# Patient Record
Sex: Female | Born: 1980 | Race: White | Hispanic: No | Marital: Married | Smoking: Never smoker
Health system: Southern US, Community
[De-identification: ages and names within clinical notes are randomized; demographics above are authoritative.]

## PROBLEM LIST (undated history)

## (undated) DIAGNOSIS — R51 Headache: Secondary | ICD-10-CM

## (undated) DIAGNOSIS — D649 Anemia, unspecified: Secondary | ICD-10-CM

## (undated) DIAGNOSIS — K219 Gastro-esophageal reflux disease without esophagitis: Secondary | ICD-10-CM

## (undated) DIAGNOSIS — E039 Hypothyroidism, unspecified: Secondary | ICD-10-CM

## (undated) DIAGNOSIS — Z8619 Personal history of other infectious and parasitic diseases: Secondary | ICD-10-CM

## (undated) DIAGNOSIS — B009 Herpesviral infection, unspecified: Secondary | ICD-10-CM

## (undated) DIAGNOSIS — O1205 Gestational edema, complicating the puerperium: Secondary | ICD-10-CM

## (undated) DIAGNOSIS — J45909 Unspecified asthma, uncomplicated: Secondary | ICD-10-CM

## (undated) DIAGNOSIS — G5600 Carpal tunnel syndrome, unspecified upper limb: Secondary | ICD-10-CM

## (undated) DIAGNOSIS — R519 Headache, unspecified: Secondary | ICD-10-CM

## (undated) HISTORY — PX: HAND SURGERY: SHX662

## (undated) HISTORY — PX: WISDOM TOOTH EXTRACTION: SHX21

## (undated) HISTORY — DX: Headache, unspecified: R51.9

## (undated) HISTORY — DX: Headache: R51

---

## 2001-03-17 ENCOUNTER — Emergency Department (HOSPITAL_COMMUNITY): Admission: EM | Admit: 2001-03-17 | Discharge: 2001-03-17 | Payer: Self-pay | Admitting: Emergency Medicine

## 2001-07-21 ENCOUNTER — Emergency Department (HOSPITAL_COMMUNITY): Admission: EM | Admit: 2001-07-21 | Discharge: 2001-07-21 | Payer: Self-pay | Admitting: Emergency Medicine

## 2003-07-11 ENCOUNTER — Emergency Department (HOSPITAL_COMMUNITY): Admission: EM | Admit: 2003-07-11 | Discharge: 2003-07-11 | Payer: Self-pay

## 2003-07-11 ENCOUNTER — Encounter: Payer: Self-pay | Admitting: Emergency Medicine

## 2004-11-24 ENCOUNTER — Inpatient Hospital Stay (HOSPITAL_COMMUNITY): Admission: AD | Admit: 2004-11-24 | Discharge: 2004-11-27 | Payer: Self-pay | Admitting: Obstetrics & Gynecology

## 2007-05-01 ENCOUNTER — Ambulatory Visit (HOSPITAL_COMMUNITY): Admission: RE | Admit: 2007-05-01 | Discharge: 2007-05-01 | Payer: Self-pay | Admitting: Obstetrics & Gynecology

## 2007-05-01 ENCOUNTER — Ambulatory Visit: Payer: Self-pay | Admitting: Vascular Surgery

## 2007-05-01 ENCOUNTER — Encounter (INDEPENDENT_AMBULATORY_CARE_PROVIDER_SITE_OTHER): Payer: Self-pay | Admitting: Obstetrics & Gynecology

## 2007-05-24 ENCOUNTER — Encounter (INDEPENDENT_AMBULATORY_CARE_PROVIDER_SITE_OTHER): Payer: Self-pay | Admitting: Obstetrics & Gynecology

## 2007-05-24 ENCOUNTER — Inpatient Hospital Stay (HOSPITAL_COMMUNITY): Admission: AD | Admit: 2007-05-24 | Discharge: 2007-05-27 | Payer: Self-pay | Admitting: Obstetrics & Gynecology

## 2007-11-12 ENCOUNTER — Emergency Department (HOSPITAL_COMMUNITY): Admission: EM | Admit: 2007-11-12 | Discharge: 2007-11-13 | Payer: Self-pay | Admitting: Emergency Medicine

## 2009-01-29 ENCOUNTER — Telehealth (INDEPENDENT_AMBULATORY_CARE_PROVIDER_SITE_OTHER): Payer: Self-pay | Admitting: *Deleted

## 2009-01-29 ENCOUNTER — Ambulatory Visit: Payer: Self-pay | Admitting: Internal Medicine

## 2009-01-29 DIAGNOSIS — M129 Arthropathy, unspecified: Secondary | ICD-10-CM | POA: Insufficient documentation

## 2009-01-29 DIAGNOSIS — J301 Allergic rhinitis due to pollen: Secondary | ICD-10-CM | POA: Insufficient documentation

## 2009-01-29 DIAGNOSIS — G5600 Carpal tunnel syndrome, unspecified upper limb: Secondary | ICD-10-CM | POA: Insufficient documentation

## 2009-01-29 DIAGNOSIS — R05 Cough: Secondary | ICD-10-CM

## 2009-06-23 ENCOUNTER — Ambulatory Visit: Payer: Self-pay | Admitting: Internal Medicine

## 2010-05-27 ENCOUNTER — Inpatient Hospital Stay (HOSPITAL_COMMUNITY): Admission: AD | Admit: 2010-05-27 | Discharge: 2010-05-28 | Payer: Self-pay | Admitting: Obstetrics and Gynecology

## 2010-08-31 ENCOUNTER — Inpatient Hospital Stay (HOSPITAL_COMMUNITY): Admission: AD | Admit: 2010-08-31 | Discharge: 2010-08-31 | Payer: Self-pay | Admitting: Obstetrics and Gynecology

## 2010-09-18 ENCOUNTER — Inpatient Hospital Stay (HOSPITAL_COMMUNITY)
Admission: AD | Admit: 2010-09-18 | Discharge: 2010-09-18 | Payer: Self-pay | Source: Home / Self Care | Admitting: Obstetrics

## 2010-09-25 ENCOUNTER — Inpatient Hospital Stay (HOSPITAL_COMMUNITY)
Admission: AD | Admit: 2010-09-25 | Discharge: 2010-09-28 | Payer: Self-pay | Source: Home / Self Care | Attending: Obstetrics | Admitting: Obstetrics

## 2010-11-07 ENCOUNTER — Other Ambulatory Visit: Payer: Self-pay | Admitting: Obstetrics & Gynecology

## 2010-12-26 LAB — CBC
MCH: 29.4 pg (ref 26.0–34.0)
MCV: 87.7 fL (ref 78.0–100.0)
Platelets: 162 10*3/uL (ref 150–400)
Platelets: 183 10*3/uL (ref 150–400)
RBC: 3.77 MIL/uL — ABNORMAL LOW (ref 3.87–5.11)
RBC: 4.29 MIL/uL (ref 3.87–5.11)
RDW: 17.2 % — ABNORMAL HIGH (ref 11.5–15.5)
WBC: 9.4 10*3/uL (ref 4.0–10.5)

## 2010-12-26 LAB — GLUCOSE, CAPILLARY: Glucose-Capillary: 108 mg/dL — ABNORMAL HIGH (ref 70–99)

## 2010-12-26 LAB — URINALYSIS, DIPSTICK ONLY
Glucose, UA: NEGATIVE mg/dL
Ketones, ur: NEGATIVE mg/dL
Leukocytes, UA: NEGATIVE
Nitrite: NEGATIVE
Specific Gravity, Urine: 1.02 (ref 1.005–1.030)
pH: 7 (ref 5.0–8.0)

## 2010-12-26 LAB — RPR: RPR Ser Ql: NONREACTIVE

## 2010-12-26 LAB — WET PREP, GENITAL: Clue Cells Wet Prep HPF POC: NONE SEEN

## 2010-12-26 LAB — ABO/RH: ABO/RH(D): O POS

## 2010-12-27 LAB — URINALYSIS, DIPSTICK ONLY
Bilirubin Urine: NEGATIVE
Hgb urine dipstick: NEGATIVE
Ketones, ur: NEGATIVE mg/dL
Protein, ur: NEGATIVE mg/dL
Urobilinogen, UA: 0.2 mg/dL (ref 0.0–1.0)

## 2010-12-27 LAB — COMPREHENSIVE METABOLIC PANEL
ALT: 16 U/L (ref 0–35)
Albumin: 2.5 g/dL — ABNORMAL LOW (ref 3.5–5.2)
Alkaline Phosphatase: 121 U/L — ABNORMAL HIGH (ref 39–117)
Chloride: 104 mEq/L (ref 96–112)
Glucose, Bld: 108 mg/dL — ABNORMAL HIGH (ref 70–99)
Potassium: 3.6 mEq/L (ref 3.5–5.1)
Sodium: 135 mEq/L (ref 135–145)
Total Protein: 5.6 g/dL — ABNORMAL LOW (ref 6.0–8.3)

## 2010-12-27 LAB — CBC
HCT: 35.2 % — ABNORMAL LOW (ref 36.0–46.0)
Platelets: 183 10*3/uL (ref 150–400)
RBC: 4.05 MIL/uL (ref 3.87–5.11)
RDW: 16.4 % — ABNORMAL HIGH (ref 11.5–15.5)
WBC: 9.1 10*3/uL (ref 4.0–10.5)

## 2010-12-30 LAB — URINALYSIS, ROUTINE W REFLEX MICROSCOPIC
Bilirubin Urine: NEGATIVE
Glucose, UA: NEGATIVE mg/dL
Ketones, ur: NEGATIVE mg/dL
Protein, ur: NEGATIVE mg/dL

## 2010-12-30 LAB — URINE MICROSCOPIC-ADD ON

## 2011-03-03 NOTE — H&P (Signed)
NAME:  Adrienne Cline, Adrienne Cline          ACCOUNT NO.:  [id number]   MEDICAL RECORD NO.:  [id number]          PATIENT TYPE:  INP   LOCATION:  9167                          FACILITY:  [facility]   PHYSICIAN:  Genia Del, M.D.DATE OF BIRTH:  03-16-1981   DATE OF ADMISSION:  11/24/2004  DATE OF DISCHARGE:                                HISTORY & PHYSICAL   HISTORY AND PHYSICAL:  Ms. Elbaum is a 30 year old G1 at 51 weeks and  4 days gestation, expected date of delivery was November 20, 2004.   REASON FOR ADMISSION:  Induction for postdates.   HISTORY OF PRESENT ILLNESS:  Fetal movements positive.  No regular uterine  contractions/  No vaginal bleeding.  No fluid leak.  No PIH symptoms.  The  patient is on Valtrex for recurrent genital herpes.  No recent lesions.  She  did have a culture for HSV that was negative at 37+ weeks.  The culture was  done for persistent irritation on the vulva.  No typical lesions of herpes  seen.   MEDICATIONS:  Valtrex 500 daily.   No known drug allergies   PAST MEDICAL HISTORY:  1.  Genital HSV.  2.  Carpal tunnel syndrome.   PAST SURGICAL HISTORY:  Negative.   SOCIAL HISTORY:  Nonsmoker, married.   History of present pregnancy was normal.  Hemoglobin 14.3, platelets 236, O  positive, Rh negative, RPR nonreactive, HbsAg nonreactive, HIV declined,  rubella immune.  An ultrasound in the first trimester was done for first  trimester spotting.  Dating corresponded well.  A triple test was done at  16+ weeks that was within normal limits.  Ultrasound review of anatomy at  19+ weeks was within normal.  In the third trimester, 1 hour GGT was normal.  Group B strep was negative at 35+ weeks.  The patient thought she had  genital HSV in the third trimester but as mentioned in the HPI, HSV culture  came back negative at 37+ weeks.  The patient has been on Valtrex  prophylaxis since 35 weeks.  Estimated fetal weight was appropriate for  gestational age.   REVIEW OF SYSTEMS:  Constitutional, HEENT, cardiovascular, respiratory, GI,  uro, neuro, endocrine, dermatologic:  Within normal limits.   PHYSICAL EXAMINATION:  GENERAL:  No apparent distress.  VITAL SIGNS:  Temperature 98.1, blood pressure 137/91 on admission, then  normal.  LUNGS:  Clear.  HEART:  Regular cardiac rhythm.  ABDOMEN:  Gravid uterus, cephalic presentation.  VAGINAL EXAM:  Three centimeters, 80%. vertex, -1, membranes intact.  LOWER LIMBS:  Normal.   Fetal heart rate monitoring reactive, baseline 140-150, no decelerations, no  regular uterine contractions.   IMPRESSION:  1.  G1 40 weeks and 4 days.  2.  Fetal well being reassuring.  3.  Group B strep negative.  4.  History of genital herpes, no active lesion.   PLAN:  1.  Admit to labor and delivery.  2.  Pitocin induction with artificial rupture of membranes.  3.  Monitoring.  4.  Expectant management towards probable vaginal delivery.      ML/MEDQ  D:  11/24/2004  T:  11/24/2004  Job:  [id number]

## 2011-03-03 NOTE — H&P (Signed)
NAME:  Adrienne Cline, Adrienne Cline          ACCOUNT NO.:  [id number]   MEDICAL RECORD NO.:  [id number]          PATIENT TYPE:  INP   LOCATION:  9170                          FACILITY:  [facility]   PHYSICIAN:  Lenoard Aden, M.D.DATE OF BIRTH:  [birth year]   DATE OF ADMISSION:  05/24/2007  DATE OF DISCHARGE:                              HISTORY & PHYSICAL   ADMISSION DIAGNOSIS:  Proteinuria.   She is a 30 year old white female G2 P1 at 38+ weeks gestation.  She  presents with proteinuria and pedal edema.  Normal blood pressures,  normal labs, and no signs or symptoms of pre-eclampsia per Dr. Seymour Bars.  She was admitted for induction of labor.   She has no known drug allergies, medications, or prenatal vitamins.  She  has a remote history of HSV for which she declines any recurrent  lesions.  She had as of May 09, 2007 not started her Valtrex.  Prenatal  course otherwise uncomplicated.   FAMILY HISTORY:  Remarkable for heart disease, migraine, hypertension,  and stroke and stomach cancer.   PAST MEDICAL HISTORY:  She has a history of carpal tunnel syndrome.  HSV  as noted, history of cystitis.  No medical, surgical hospitalizations.  She had previous pregnancy at term and intrauterine pregnancy vaginal  delivery 7 pound 1 ounce fetus.   PHYSICAL EXAMINATION:  VITAL SIGNS:  Blood pressure 120/73.  HEENT:  Normal  LUNGS:  Clear.  HEART:  Regular rate and rhythm.  ABDOMEN:  Soft.  Gravid.  Nontender.  No right upper quadrant tenderness  noted.  EXTREMITIES:  DTRs 2+.  No evidence of clonus.  2+ pretibial edema  noted.  PELVIC:  Cervix is 2 cm, thick, vertex, -2.  Artifical rupture of  membranes clear fluid.  SKIN:  Intact.  NEUROLOGIC:  Intact.   LABORATORY DATA:  CBC stable with platelet count of 140,000.  Her liver  function tests and comprehensive metabolic profile are pending.   IMPRESSION:  1. A 38 week intrauterine pregnancy.  2. GBS positive.  3. Proteinuria without strict  criteria for pre-eclampsia.   PLAN:  Per her discussion with Dr. Seymour Bars, the patient to proceed with  pitocin and induction.      Lenoard Aden, M.D.  Electronically Signed     RJT/MEDQ  D:  05/24/2007  T:  05/24/2007  Job:  [id number]

## 2011-03-30 ENCOUNTER — Other Ambulatory Visit: Payer: Self-pay | Admitting: Allergy

## 2011-03-30 ENCOUNTER — Ambulatory Visit
Admission: RE | Admit: 2011-03-30 | Discharge: 2011-03-30 | Disposition: A | Discharge status: Home / Self Care | Payer: BC Managed Care – PPO | Source: Ambulatory Visit | Attending: Allergy | Admitting: Allergy

## 2011-03-30 DIAGNOSIS — R05 Cough: Secondary | ICD-10-CM

## 2011-07-06 LAB — DIFFERENTIAL
Lymphs Abs: 0.5 — ABNORMAL LOW
Monocytes Absolute: 0.6
Monocytes Relative: 6
Neutro Abs: 9.8 — ABNORMAL HIGH
Neutrophils Relative %: 89 — ABNORMAL HIGH

## 2011-07-06 LAB — URINALYSIS, ROUTINE W REFLEX MICROSCOPIC
Glucose, UA: NEGATIVE
Hgb urine dipstick: NEGATIVE
Ketones, ur: 15 — AB
Protein, ur: 30 — AB
pH: 6

## 2011-07-06 LAB — BASIC METABOLIC PANEL
CO2: 26
Calcium: 9
Chloride: 103
Creatinine, Ser: 0.97
GFR calc Af Amer: >60
Sodium: 138

## 2011-07-06 LAB — URINE MICROSCOPIC-ADD ON

## 2011-07-06 LAB — CBC
Hemoglobin: 14.6
RBC: 4.98
WBC: 11.1 — ABNORMAL HIGH

## 2011-07-06 LAB — PREGNANCY, URINE: Preg Test, Ur: NEGATIVE

## 2011-07-31 LAB — COMPREHENSIVE METABOLIC PANEL
ALT: 13
ALT: 16
AST: 29
AST: 33
Albumin: 1.9 — ABNORMAL LOW
Alkaline Phosphatase: 126 — ABNORMAL HIGH
BUN: 5 — ABNORMAL LOW
Calcium: 8.4
Chloride: 107
Creatinine, Ser: 0.74
GFR calc Af Amer: >60
Potassium: 4
Sodium: 136
Sodium: 136
Total Bilirubin: 0.3
Total Protein: 6.1

## 2011-07-31 LAB — CBC
HCT: 36.2
HCT: 39.7
Hemoglobin: 12.2
Hemoglobin: 13.1
MCHC: 33
MCV: 90.8
Platelets: 142 — ABNORMAL LOW
RBC: 4.37
RDW: 14.4 — ABNORMAL HIGH
WBC: 14 — ABNORMAL HIGH

## 2011-07-31 LAB — MAGNESIUM
Magnesium: 4 — ABNORMAL HIGH
Magnesium: 4.6 — ABNORMAL HIGH

## 2011-07-31 LAB — LACTATE DEHYDROGENASE: LDH: 173

## 2011-07-31 LAB — URIC ACID: Uric Acid, Serum: 5.4

## 2011-12-11 ENCOUNTER — Encounter (HOSPITAL_COMMUNITY): Payer: Self-pay | Admitting: *Deleted

## 2011-12-11 ENCOUNTER — Emergency Department (HOSPITAL_COMMUNITY)
Admission: EM | Admit: 2011-12-11 | Discharge: 2011-12-12 | Disposition: A | Discharge status: Home / Self Care | Payer: Medicaid Other | Attending: Emergency Medicine | Admitting: Emergency Medicine

## 2011-12-11 ENCOUNTER — Other Ambulatory Visit: Payer: Self-pay

## 2011-12-11 DIAGNOSIS — R071 Chest pain on breathing: Secondary | ICD-10-CM | POA: Insufficient documentation

## 2011-12-11 DIAGNOSIS — R079 Chest pain, unspecified: Secondary | ICD-10-CM | POA: Insufficient documentation

## 2011-12-11 DIAGNOSIS — R0789 Other chest pain: Secondary | ICD-10-CM

## 2011-12-11 LAB — CBC
HCT: 42.1 % (ref 36.0–46.0)
Hemoglobin: 14.1 g/dL (ref 12.0–15.0)
MCH: 29.9 pg (ref 26.0–34.0)
MCHC: 33.5 g/dL (ref 30.0–36.0)
MCV: 89.4 fL (ref 78.0–100.0)
RBC: 4.71 MIL/uL (ref 3.87–5.11)

## 2011-12-11 LAB — COMPREHENSIVE METABOLIC PANEL
Albumin: 3.8 g/dL (ref 3.5–5.2)
Alkaline Phosphatase: 98 U/L (ref 39–117)
BUN: 9 mg/dL (ref 6–23)
CO2: 28 mEq/L (ref 19–32)
Chloride: 104 mEq/L (ref 96–112)
Creatinine, Ser: 0.96 mg/dL (ref 0.50–1.10)
GFR calc non Af Amer: 79 mL/min — ABNORMAL LOW (ref >90)
Potassium: 3.5 mEq/L (ref 3.5–5.1)
Total Bilirubin: 0.3 mg/dL (ref 0.3–1.2)

## 2011-12-11 LAB — DIFFERENTIAL
Basophils Relative: 0 % (ref 0–1)
Eosinophils Absolute: 0.3 10*3/uL (ref 0.0–0.7)
Eosinophils Relative: 3 % (ref 0–5)
Lymphs Abs: 2.4 10*3/uL (ref 0.7–4.0)
Monocytes Absolute: 0.8 10*3/uL (ref 0.1–1.0)
Monocytes Relative: 7 % (ref 3–12)

## 2011-12-11 LAB — POCT I-STAT TROPONIN I: Troponin i, poc: 0 ng/mL (ref 0.00–0.08)

## 2011-12-11 MED ORDER — IBUPROFEN 800 MG PO TABS
800.0000 mg | ORAL_TABLET | Freq: Once | ORAL | Status: AC
  Administered 2011-12-12: 800 mg via ORAL
  Filled 2011-12-11: qty 1

## 2011-12-11 NOTE — ED Notes (Signed)
The pt has had lt sided anterior and posterior chest pain since Saturday.  Whenever she rubs her hand across her lt chest she has pain  Then and with movement.  No rash visible

## 2011-12-12 ENCOUNTER — Emergency Department (HOSPITAL_COMMUNITY): Payer: Medicaid Other

## 2011-12-12 LAB — POCT PREGNANCY, URINE: Preg Test, Ur: NEGATIVE

## 2011-12-12 MED ORDER — TRAMADOL HCL 50 MG PO TABS: 50.0000 mg | ORAL_TABLET | Freq: Four times a day (QID) | ORAL | Status: AC | PRN

## 2011-12-12 NOTE — ED Provider Notes (Signed)
History     CSN: [id number]  Arrival date & time 12/11/11  2024   First MD Initiated Contact with Patient 12/11/11 2348      Chief Complaint  Patient presents with  . Chest Pain    (Consider location/radiation/quality/duration/timing/severity/associated sxs/prior treatment) HPI  Pt presents to the ED with complaints of chest pains since satruday. She has a history of costchondritis and states this feels different because it includes her shoulder blade and her fingers are tingling. She denies SOB, although the pain worsens with a big breath. The pain worsens when she touches her chest and her shoulder blade hurts. The patient admits to "having a lot of issues".  History reviewed. No pertinent past medical history.  History reviewed. No pertinent past surgical history.  History reviewed. No pertinent family history.  History  Substance Use Topics  . Smoking status: Never Smoker   . Smokeless tobacco: Not on file  . Alcohol Use: Yes    OB History    Grav Para Term Preterm Abortions TAB SAB Ect Mult Living                  Review of Systems  All other systems reviewed and are negative.    Allergies  Review of patient's allergies indicates no known allergies.  Home Medications   Current Outpatient Rx  Name Route Sig Dispense Refill  . TRAMADOL HCL 50 MG PO TABS Oral Take 1 tablet (50 mg total) by mouth every 6 (six) hours as needed for pain. 15 tablet 0    BP 120/76  Pulse 86  Temp(Src) 97.9 F (36.6 C) (Oral)  Resp 16  SpO2 100%  LMP 11/20/2011  Physical Exam  Nursing note and vitals reviewed. Constitutional: She appears well-developed and well-nourished. No distress.  HENT:  Head: Normocephalic and atraumatic.  Eyes: Pupils are equal, round, and reactive to light.  Neck: Normal range of motion. Neck supple.  Cardiovascular: Normal rate and regular rhythm.   Pulmonary/Chest: Effort normal and breath sounds normal. No respiratory distress. She has no  wheezes. She has no rales.   She exhibits no tenderness.    Abdominal: Soft.  Neurological: She is alert.  Skin: Skin is warm and dry.    ED Course  Procedures (including critical care time)  Labs Reviewed  COMPREHENSIVE METABOLIC PANEL - Abnormal; Notable for the following:    GFR calc non Af Amer 79 (*)    All other components within normal limits  CBC  DIFFERENTIAL  CK TOTAL AND CKMB  POCT I-STAT TROPONIN I  POCT I-STAT TROPONIN I  POCT PREGNANCY, URINE   No results found.   1. Chest wall pain       MDM  Pt dicussed with Dr. Dierdre Highman. Both sets of cardiac markers negative.    Date: 12/12/2011  Rate: 86  Rhythm: normal sinus rhythm  QRS Axis: normal  Intervals: normal  ST/T Wave abnormalities: normal  Conduction Disutrbances:none  Narrative Interpretation:   Old EKG Reviewed: none available  Pt given Tramadol for pain management and is to follow-up with PCP or return to the ED as needed.        Dorthula Matas, PA 12/12/11 0110  Medical screening examination/treatment/procedure(s) were performed by non-physician practitioner and as supervising physician I was immediately available for consultation/collaboration.  Sunnie Nielsen, MD 12/12/11 [phone]

## 2012-11-27 ENCOUNTER — Other Ambulatory Visit: Payer: Self-pay | Admitting: Obstetrics & Gynecology

## 2012-11-27 ENCOUNTER — Ambulatory Visit (HOSPITAL_COMMUNITY)
Admission: RE | Admit: 2012-11-27 | Discharge: 2012-11-27 | Disposition: A | Discharge status: Home / Self Care | Payer: Managed Care, Other (non HMO) | Source: Ambulatory Visit | Attending: Obstetrics & Gynecology | Admitting: Obstetrics & Gynecology

## 2012-11-27 ENCOUNTER — Encounter (HOSPITAL_COMMUNITY)
Admission: RE | Disposition: A | Discharge status: Home / Self Care | Payer: Self-pay | Source: Ambulatory Visit | Attending: Obstetrics & Gynecology

## 2012-11-27 ENCOUNTER — Encounter (HOSPITAL_COMMUNITY): Payer: Self-pay | Admitting: Anesthesiology

## 2012-11-27 ENCOUNTER — Ambulatory Visit (HOSPITAL_COMMUNITY): Payer: Managed Care, Other (non HMO) | Admitting: Anesthesiology

## 2012-11-27 DIAGNOSIS — O00109 Unspecified tubal pregnancy without intrauterine pregnancy: Secondary | ICD-10-CM | POA: Insufficient documentation

## 2012-11-27 DIAGNOSIS — O034 Incomplete spontaneous abortion without complication: Secondary | ICD-10-CM | POA: Insufficient documentation

## 2012-11-27 DIAGNOSIS — N83209 Unspecified ovarian cyst, unspecified side: Secondary | ICD-10-CM | POA: Insufficient documentation

## 2012-11-27 DIAGNOSIS — N949 Unspecified condition associated with female genital organs and menstrual cycle: Secondary | ICD-10-CM | POA: Insufficient documentation

## 2012-11-27 HISTORY — PX: UNILATERAL SALPINGECTOMY: SHX6160

## 2012-11-27 HISTORY — PX: OVARIAN CYST REMOVAL: SHX89

## 2012-11-27 HISTORY — DX: Gastro-esophageal reflux disease without esophagitis: K21.9

## 2012-11-27 HISTORY — DX: Unspecified asthma, uncomplicated: J45.909

## 2012-11-27 HISTORY — PX: DILATION AND CURETTAGE OF UTERUS: SHX78

## 2012-11-27 HISTORY — PX: LAPAROSCOPY: SHX197

## 2012-11-27 HISTORY — DX: Headache: R51

## 2012-11-27 LAB — CBC
Hemoglobin: 13.1 g/dL (ref 12.0–15.0)
MCH: 28.5 pg (ref 26.0–34.0)
MCHC: 32 g/dL (ref 30.0–36.0)
RDW: 14.5 % (ref 11.5–15.5)
WBC: 11 10*3/uL — ABNORMAL HIGH (ref 4.0–10.5)

## 2012-11-27 LAB — TYPE AND SCREEN: ABO/RH(D): O POS

## 2012-11-27 LAB — SURGICAL PCR SCREEN: Staphylococcus aureus: POSITIVE — AB

## 2012-11-27 SURGERY — LAPAROSCOPY OPERATIVE
Anesthesia: General | Wound class: Clean Contaminated

## 2012-11-27 MED ORDER — FENTANYL CITRATE 0.05 MG/ML IJ SOLN
INTRAMUSCULAR | Status: AC
  Administered 2012-11-27: 50 ug via INTRAVENOUS
  Filled 2012-11-27: qty 2

## 2012-11-27 MED ORDER — OXYCODONE-ACETAMINOPHEN 7.5-325 MG PO TABS: 1.0000 | ORAL_TABLET | ORAL | Status: DC | PRN

## 2012-11-27 MED ORDER — PROMETHAZINE HCL 25 MG RE SUPP
25.0000 mg | RECTAL | Status: DC | PRN
  Administered 2012-11-27: 25 mg via RECTAL

## 2012-11-27 MED ORDER — LIDOCAINE HCL (CARDIAC) 20 MG/ML IV SOLN
INTRAVENOUS | Status: AC
  Filled 2012-11-27: qty 5

## 2012-11-27 MED ORDER — FENTANYL CITRATE 0.05 MG/ML IJ SOLN
INTRAMUSCULAR | Status: DC | PRN
  Administered 2012-11-27: 100 ug via INTRAVENOUS
  Administered 2012-11-27: 50 ug via INTRAVENOUS
  Administered 2012-11-27: 100 ug via INTRAVENOUS
  Administered 2012-11-27: 50 ug via INTRAVENOUS
  Administered 2012-11-27 (×2): 100 ug via INTRAVENOUS

## 2012-11-27 MED ORDER — CEFAZOLIN SODIUM-DEXTROSE 2-3 GM-% IV SOLR
INTRAVENOUS | Status: AC
  Administered 2012-11-27: 2 g via INTRAVENOUS
  Filled 2012-11-27: qty 50

## 2012-11-27 MED ORDER — CHLOROPROCAINE HCL 1 % IJ SOLN
INTRAMUSCULAR | Status: DC | PRN
  Administered 2012-11-27: 20 mL

## 2012-11-27 MED ORDER — ONDANSETRON HCL 4 MG/2ML IJ SOLN
INTRAMUSCULAR | Status: AC
  Filled 2012-11-27: qty 2

## 2012-11-27 MED ORDER — EPHEDRINE SULFATE 50 MG/ML IJ SOLN
5.0000 mg | INTRAMUSCULAR | Status: DC | PRN
  Administered 2012-11-27 (×3): 5 mg via INTRAVENOUS

## 2012-11-27 MED ORDER — ONDANSETRON HCL 4 MG/2ML IJ SOLN
INTRAMUSCULAR | Status: AC
  Administered 2012-11-27: 4 mg
  Filled 2012-11-27: qty 2

## 2012-11-27 MED ORDER — NEOSTIGMINE METHYLSULFATE 1 MG/ML IJ SOLN
INTRAMUSCULAR | Status: AC
  Filled 2012-11-27: qty 1

## 2012-11-27 MED ORDER — LIDOCAINE HCL (CARDIAC) 20 MG/ML IV SOLN
INTRAVENOUS | Status: DC | PRN
  Administered 2012-11-27: 60 mg via INTRAVENOUS

## 2012-11-27 MED ORDER — ONDANSETRON HCL 4 MG/2ML IJ SOLN
INTRAMUSCULAR | Status: DC | PRN
  Administered 2012-11-27: 4 mg via INTRAVENOUS

## 2012-11-27 MED ORDER — SCOPOLAMINE 1 MG/3DAYS TD PT72
MEDICATED_PATCH | TRANSDERMAL | Status: AC
  Administered 2012-11-27: 1.5 mg via TRANSDERMAL
  Filled 2012-11-27: qty 1

## 2012-11-27 MED ORDER — MUPIROCIN 2 % EX OINT
TOPICAL_OINTMENT | CUTANEOUS | Status: AC
  Filled 2012-11-27: qty 22

## 2012-11-27 MED ORDER — NEOSTIGMINE METHYLSULFATE 1 MG/ML IJ SOLN
INTRAMUSCULAR | Status: DC | PRN
  Administered 2012-11-27: 2 mg via INTRAVENOUS

## 2012-11-27 MED ORDER — PROMETHAZINE HCL 25 MG RE SUPP
RECTAL | Status: AC
  Administered 2012-11-27: 25 mg via RECTAL
  Filled 2012-11-27: qty 1

## 2012-11-27 MED ORDER — DEXAMETHASONE SODIUM PHOSPHATE 10 MG/ML IJ SOLN
INTRAMUSCULAR | Status: DC | PRN
  Administered 2012-11-27: 10 mg via INTRAVENOUS

## 2012-11-27 MED ORDER — LACTATED RINGERS IV SOLN
INTRAVENOUS | Status: DC
  Administered 2012-11-27 (×4): via INTRAVENOUS

## 2012-11-27 MED ORDER — BUPIVACAINE HCL (PF) 0.25 % IJ SOLN
INTRAMUSCULAR | Status: DC | PRN
  Administered 2012-11-27: 14 mL

## 2012-11-27 MED ORDER — OXYCODONE-ACETAMINOPHEN 5-325 MG PO TABS
ORAL_TABLET | ORAL | Status: AC
  Filled 2012-11-27: qty 1

## 2012-11-27 MED ORDER — MUPIROCIN 2 % EX OINT
TOPICAL_OINTMENT | Freq: Two times a day (BID) | CUTANEOUS | Status: DC
  Administered 2012-11-27: 14:00:00 via NASAL

## 2012-11-27 MED ORDER — ONDANSETRON HCL 4 MG/2ML IJ SOLN: 4.0000 mg | Freq: Once | INTRAMUSCULAR | Status: DC

## 2012-11-27 MED ORDER — ROCURONIUM BROMIDE 50 MG/5ML IV SOLN
INTRAVENOUS | Status: AC
  Filled 2012-11-27: qty 1

## 2012-11-27 MED ORDER — CHLOROPROCAINE HCL 1 % IJ SOLN
INTRAMUSCULAR | Status: AC
  Filled 2012-11-27: qty 30

## 2012-11-27 MED ORDER — EPHEDRINE SULFATE 50 MG/ML IJ SOLN
INTRAMUSCULAR | Status: AC
  Administered 2012-11-27: 5 mg via INTRAVENOUS
  Filled 2012-11-27: qty 1

## 2012-11-27 MED ORDER — DEXAMETHASONE SODIUM PHOSPHATE 10 MG/ML IJ SOLN
INTRAMUSCULAR | Status: AC
  Filled 2012-11-27: qty 1

## 2012-11-27 MED ORDER — GLYCOPYRROLATE 0.2 MG/ML IJ SOLN
INTRAMUSCULAR | Status: AC
  Filled 2012-11-27: qty 2

## 2012-11-27 MED ORDER — MIDAZOLAM HCL 2 MG/2ML IJ SOLN
INTRAMUSCULAR | Status: AC
  Filled 2012-11-27: qty 2

## 2012-11-27 MED ORDER — LACTATED RINGERS IR SOLN
Status: DC | PRN
  Administered 2012-11-27: 3000 mL

## 2012-11-27 MED ORDER — BUPIVACAINE HCL (PF) 0.25 % IJ SOLN
INTRAMUSCULAR | Status: AC
  Filled 2012-11-27: qty 30

## 2012-11-27 MED ORDER — SCOPOLAMINE 1 MG/3DAYS TD PT72
MEDICATED_PATCH | TRANSDERMAL | Status: AC
  Filled 2012-11-27: qty 1

## 2012-11-27 MED ORDER — PROPOFOL 10 MG/ML IV BOLUS
INTRAVENOUS | Status: DC | PRN
  Administered 2012-11-27: 10 mg via INTRAVENOUS
  Administered 2012-11-27: 170 mg via INTRAVENOUS

## 2012-11-27 MED ORDER — GLYCOPYRROLATE 0.2 MG/ML IJ SOLN
INTRAMUSCULAR | Status: DC | PRN
  Administered 2012-11-27: 0.4 mg via INTRAVENOUS

## 2012-11-27 MED ORDER — OXYCODONE-ACETAMINOPHEN 5-325 MG PO TABS
1.0000 | ORAL_TABLET | ORAL | Status: DC | PRN
  Administered 2012-11-27: 1 via ORAL

## 2012-11-27 MED ORDER — FENTANYL CITRATE 0.05 MG/ML IJ SOLN
INTRAMUSCULAR | Status: AC
  Filled 2012-11-27: qty 5

## 2012-11-27 MED ORDER — FENTANYL CITRATE 0.05 MG/ML IJ SOLN
25.0000 ug | INTRAMUSCULAR | Status: DC | PRN
  Administered 2012-11-27 (×3): 50 ug via INTRAVENOUS

## 2012-11-27 MED ORDER — SCOPOLAMINE 1 MG/3DAYS TD PT72
1.0000 | MEDICATED_PATCH | Freq: Once | TRANSDERMAL | Status: DC
  Administered 2012-11-27: 1.5 mg via TRANSDERMAL

## 2012-11-27 MED ORDER — ROCURONIUM BROMIDE 100 MG/10ML IV SOLN
INTRAVENOUS | Status: DC | PRN
  Administered 2012-11-27: 40 mg via INTRAVENOUS

## 2012-11-27 MED ORDER — PROPOFOL 10 MG/ML IV EMUL
INTRAVENOUS | Status: AC
  Filled 2012-11-27: qty 20

## 2012-11-27 MED ORDER — MIDAZOLAM HCL 5 MG/5ML IJ SOLN
INTRAMUSCULAR | Status: DC | PRN
  Administered 2012-11-27: 2 mg via INTRAVENOUS

## 2012-11-27 SURGICAL SUPPLY — 41 items
ADH SKN CLS APL DERMABOND .7 (GAUZE/BANDAGES/DRESSINGS) ×2
APPLICATOR COTTON TIP 6IN STRL (MISCELLANEOUS) ×3 IMPLANT
BAG SPEC RTRVL LRG 6X4 10 (ENDOMECHANICALS)
CABLE HIGH FREQUENCY MONO STRZ (ELECTRODE) IMPLANT
CANISTER SUCTION 2500CC (MISCELLANEOUS) ×3 IMPLANT
CATH ROBINSON RED A/P 16FR (CATHETERS) ×3 IMPLANT
CLOTH BEACON ORANGE TIMEOUT ST (SAFETY) ×3 IMPLANT
CONTAINER PREFILL 10% NBF 60ML (FORM) ×6 IMPLANT
CORD ACTIVE DISPOSABLE (ELECTRODE) ×1
CORD ELECTRO ACTIVE DISP (ELECTRODE) IMPLANT
DERMABOND ADVANCED (GAUZE/BANDAGES/DRESSINGS) ×1
DERMABOND ADVANCED .7 DNX12 (GAUZE/BANDAGES/DRESSINGS) ×2 IMPLANT
DRESSING TELFA 8X3 (GAUZE/BANDAGES/DRESSINGS) ×3 IMPLANT
FORCEPS CUTTING 33CM 5MM (CUTTING FORCEPS) ×1 IMPLANT
GLOVE BIO SURGEON STRL SZ 6.5 (GLOVE) ×6 IMPLANT
GLOVE BIOGEL PI IND STRL 7.0 (GLOVE) ×2 IMPLANT
GLOVE BIOGEL PI INDICATOR 7.0 (GLOVE) ×1
GOWN PREVENTION PLUS LG XLONG (DISPOSABLE) ×9 IMPLANT
GOWN STRL REIN XL XLG (GOWN DISPOSABLE) ×6 IMPLANT
IV STOPCOCK 4 WAY 40  W/Y SET (IV SOLUTION)
IV STOPCOCK 4 WAY 40 W/Y SET (IV SOLUTION) IMPLANT
MANIPULATOR UTERINE 4.5 ZUMI (MISCELLANEOUS) IMPLANT
PACK HYSTEROSCOPY LF (CUSTOM PROCEDURE TRAY) ×3 IMPLANT
PACK LAPAROSCOPY BASIN (CUSTOM PROCEDURE TRAY) ×3 IMPLANT
PAD OB MATERNITY 4.3X12.25 (PERSONAL CARE ITEMS) ×3 IMPLANT
PENCIL BUTTON HOLSTER BLD 10FT (ELECTRODE) ×3 IMPLANT
POUCH SPECIMEN RETRIEVAL 10MM (ENDOMECHANICALS) IMPLANT
PROTECTOR NERVE ULNAR (MISCELLANEOUS) ×3 IMPLANT
SEALER TISSUE G2 CVD JAW 35 (ENDOMECHANICALS) IMPLANT
SEALER TISSUE G2 CVD JAW 45CM (ENDOMECHANICALS) IMPLANT
SET BERKELEY SUCTION TUBING (SUCTIONS) ×1 IMPLANT
SET IRRIG TUBING LAPAROSCOPIC (IRRIGATION / IRRIGATOR) ×1 IMPLANT
SLEEVE Z-THREAD 5X100MM (TROCAR) IMPLANT
SUT MNCRL AB 4-0 PS2 18 (SUTURE) ×6 IMPLANT
SUT VICRYL 0 UR6 27IN ABS (SUTURE) ×6 IMPLANT
TOWEL OR 17X24 6PK STRL BLUE (TOWEL DISPOSABLE) ×6 IMPLANT
TRAY FOLEY CATH 14FR (SET/KITS/TRAYS/PACK) ×3 IMPLANT
TROCAR BALLN 12MMX100 BLUNT (TROCAR) ×3 IMPLANT
TROCAR XCEL NON-BLD 5MMX100MML (ENDOMECHANICALS) ×5 IMPLANT
TROCAR Z-THREAD FIOS 11X100 BL (TROCAR) IMPLANT
WATER STERILE IRR 1000ML POUR (IV SOLUTION) ×6 IMPLANT

## 2012-11-27 NOTE — Anesthesia Postprocedure Evaluation (Signed)
Anesthesia Post Note  Patient: Adrienne Cline  Procedure(s) Performed: Procedure(s) (LRB): LAPAROSCOPY OPERATIVE (Left) OVARIAN CYSTECTOMY (Left) UNILATERAL SALPINGECTOMY (Left) DILATATION AND CURETTAGE (N/A)  Anesthesia type: General  Patient location: PACU  Post pain: Pain level controlled  Post assessment: Post-op Vital signs reviewed  Last Vitals:  Filed Vitals:   11/27/12 1800  BP: 109/56  Pulse: 101  Temp:   Resp: 16    Post vital signs: Reviewed  Level of consciousness: sedated  Complications: No apparent anesthesia complications

## 2012-11-27 NOTE — Transfer of Care (Signed)
Immediate Anesthesia Transfer of Care Note  Patient: Adrienne Cline  Procedure(s) Performed: Procedure(s): LAPAROSCOPY OPERATIVE (Left) OVARIAN CYSTECTOMY (Left) UNILATERAL SALPINGECTOMY (Left) DILATATION AND CURETTAGE (N/A)  Patient Location: PACU  Anesthesia Type:General  Level of Consciousness: sedated  Airway & Oxygen Therapy: Patient Spontanous Breathing and Patient connected to nasal cannula oxygen  Post-op Assessment: Report given to PACU RN and Post -op Vital signs reviewed and stable  Post vital signs: stable  Complications: No apparent anesthesia complications

## 2012-11-27 NOTE — Anesthesia Preprocedure Evaluation (Addendum)
Anesthesia Evaluation  Patient identified by MRN, date of birth, ID band Patient awake    Reviewed: Allergy & Precautions, H&P , Patient's Chart, lab work & pertinent test results, reviewed documented beta blocker date and time   Airway Mallampati: II TM Distance: >3 FB Neck ROM: full    Dental no notable dental hx.    Pulmonary  breath sounds clear to auscultation  Pulmonary exam normal       Cardiovascular Rhythm:regular Rate:Normal     Neuro/Psych    GI/Hepatic   Endo/Other  Morbid obesity  Renal/GU      Musculoskeletal   Abdominal   Peds  Hematology   Anesthesia Other Findings   Reproductive/Obstetrics                           Anesthesia Physical Anesthesia Plan  ASA: III  Anesthesia Plan: General   Post-op Pain Management:    Induction: Intravenous  Airway Management Planned: Oral ETT  Additional Equipment:   Intra-op Plan:   Post-operative Plan:   Informed Consent: I have reviewed the patients History and Physical, chart, labs and discussed the procedure including the risks, benefits and alternatives for the proposed anesthesia with the patient or authorized representative who has indicated his/her understanding and acceptance.   Dental Advisory Given  Plan Discussed with: CRNA and Surgeon  Anesthesia Plan Comments: (  Discussed  general anesthesia, including possible nausea, instrumentation of airway, sore throat,pulmonary aspiration, etc. I asked if the were any outstanding questions, or  concerns before we proceeded. )       Anesthesia Quick Evaluation

## 2012-11-27 NOTE — Discharge Summary (Signed)
  Physician Discharge Summary  Patient ID: Adrienne Cline MRN: [id number] DOB/AGE: 05-28-1981 31 y.o.  Admit date: 11/27/2012 Discharge date: 11/27/2012  Admission Diagnoses: Left pelvic pain, Left ovarian cysts, Persistant quants in the 70's  [phone]  Discharge Diagnoses: Left pelvic pain, Left ovarian cysts, Persistant quants in the 70's  [id number]        Active Problems:   * No active hospital problems. *   Discharged Condition: good  Hospital Course:   Consults: None  Treatments: surgery: Laparoscopy with left ovarian cystectomies, left salpingectomy, D+C  Disposition: 01-Home or Self Care     Medication List    TAKE these medications       oxyCODONE-acetaminophen 7.5-325 MG per tablet  Commonly known as:  PERCOCET  Take 1 tablet by mouth every 4 (four) hours as needed for pain.           Follow-up Information   Follow up with Corban Kistler,[name], MD In 3 weeks.   Contact information:   [address] [place] [address] [phone]       Signed: Genia Del, MD 11/27/2012, 4:38 PM

## 2012-11-27 NOTE — H&P (Signed)
Adrienne Cline is an 32 y.o. female G4P3 Early incomplete spont. Ab vs ectopic  RP:  Left pelvic pain with complex left ovarian cysts and possible ectopic pregnancy  Pertinent Gynecological History:  Bleeding: Spotting 1st trimester Contraception: none Blood transfusions: none Sexually transmitted diseases: past history: Genital HSV Previous GYN Procedures: None  Last mammogram: none  Last pap: normal OB History: G4P3  SVD x 3                     Current:  Persistent Quant BHCG around 70's  Menstrual History:No LMP recorded.    No past medical history on file.  No past surgical history on file.  No family history on file.  Social History:  reports that she has never smoked. She does not have any smokeless tobacco history on file. She reports that  drinks alcohol. Her drug history is not on file.  Allergies: No Known Allergies  No prescriptions prior to admission     Blood pressure 108/66, pulse 102, temperature 98.8 F (37.1 C), temperature source Oral, resp. rate 18, height 5' 2.25" (1.581 m), weight 106.142 kg (234 lb), SpO2 100.00%.  Pelvic US today at [facility]:  Left ovarian cysts 5.8 and 4.1 cm, complex.  Small FF.  No IUP, no adnexal mass otherwise.  Results for orders placed during the hospital encounter of 11/27/12 (from the past 24 hour(s))  CBC     Status: Abnormal   Collection Time    11/27/12 12:40 PM      Result Value Range   WBC 11.0 (*) 4.0 - 10.5 K/uL   RBC 4.59  3.87 - 5.11 MIL/uL   Hemoglobin 13.1  12.0 - 15.0 g/dL   HCT 16.1  09.6 - 04.5 %   MCV 89.3  78.0 - 100.0 fL   MCH 28.5  26.0 - 34.0 pg   MCHC 32.0  30.0 - 36.0 g/dL   RDW 40.9  81.1 - 91.4 %   Platelets 231  150 - 400 K/uL    No results found.  Assessment/Plan: Left pelvic pain, left complex ovarian cysts, possible ectopic pregnancy for LPS left ovarian cystectomies, treatment of possible ectopic, D+E.  Surgery and risks reviewed.  Pihu Basil,[name] 11/27/2012, 1:25 PM

## 2012-11-27 NOTE — Op Note (Signed)
11/27/2012  4:20 PM  PATIENT:  Adrienne Cline  32 y.o. female  PRE-OPERATIVE DIAGNOSIS:  Left Pelvic Pain; Left  Ovarian Cysts; Persistent quants in the 70's  [id number]  POST-OPERATIVE DIAGNOSIS:  Left Pelvic Pain; Left  Ovarian Cysts; Persistent quants in the 70's  [id number]  PROCEDURE:  Procedure(s): LAPAROSCOPY OPERATIVE OVARIAN CYSTECTOMIES, LEFT SALPINGECTOMY, DILATATION AND CURETTAGE  SURGEON:  Surgeon(s): Genia Del, MD Alphonsus Sias. Ernestina Penna, MD Alphonsus Sias Ernestina Penna, MD  ASSISTANTS: Dr Lendon Colonel   ANESTHESIA:   general  PROCEDURE:  Under general anesthesia with endotracheal intubation. The patient is in lithotomy position. She is prepped with ChloraPrep on the abdomen and with Betadine on the suprapubic, vulvar and vaginal areas. She is draped as usual. The Foley is put in place in the bladder. A 1 tooth uterine cannula is inserted in the uterus. We infiltrate the infraumbilical area with Marcaine one quarter plain. We make a 1.5 cm incision at that level. We opened the aponeurosis under direct vision with the Mayo scissors. We opened the peritoneum bluntly with the finger. A pursestring stitch of Vicryl 0 is applied on the aponeurosis. The Roseanne Reno is inserted under direct vision and a pneumoperitoneum is created.  We infiltrate the skin with Marcaine one quarter plain at the right and left sites.  A 5 mm incision is done with the scalpel on both locations. We insert the two 5 mm ports  On the right and left side of the abdomen under direct vision. The patient is put in Trendelenburg.  The uterus appears normal in size and the aspect. The right tube and ovary are normal to inspection. The left ovary presents to large cysts which appeared thin-walled with no solid part.  The left tube present on dilation in its midportion and also adherent at the distal end with the pelvic wall.  Pictures are taken wall the pelvic anatomy.  We used the hot scissors to open the left ovary at the site  of the largest cyst.  We then removed the cyst wall with traction and countertraction.  We proceeded the same way for the second cyst.  We then took the decision of removing the left tube given the possible ectopic enlarging its diameter and also the adhesions distally.  With bipolar current we cauterized and section  The proximal tube close to the uterus and then followed along the left mesosalpinx.  The tube was completely excised.  It was removed in a bag and sent to pathology with the other specimens.  We then irrigated and suctioned abundantly. Hemostasis was completed on the level of the left ovary with a bipolar polar current.  We then removed from all laparoscopy instruments. Removed the trochars under direct vision. The pursestring stitch was attached.  All incisions were closed with a Vicryl 4-0 in a subcuticular stitch. Dermabond was added on the 3 incisions.  We then proceeded with the dilation and curettage.  The speculum was inserted in the vagina. The cannula was removed from the uterus. The tenaculum was put on the anterior lip of the cervix. The hysterometry was at 9 cm. Dilation of the cervix with Hegar dilators up to #19 without difficulty. We used a medium-size sharp curet to curettage the endometrial cavity systematically on all surfaces. The endometrial curettings were sent to pathology.  We then removed the tenaculum. Hemostasis was adequate. The speculum was removed as well.  The patient was brought to recover room in good and stable condition.  ESTIMATED BLOOD LOSS:  75 cc   Intake/Output Summary (Last 24 hours) at 11/27/12 1620 Last data filed at 11/27/12 1619  Gross per 24 hour  Intake   2100 ml  Output    175 ml  Net   1925 ml     BLOOD ADMINISTERED:none   LOCAL MEDICATIONS USED:  MARCAINE     SPECIMEN:  Source of Specimen:  Left ovarian cyst walls, left tube, endometrial curettings  DISPOSITION OF SPECIMEN:  PATHOLOGY  COUNTS:  YES  PLAN OF CARE: Transfer to PACU    Genia Del MD  11/27/2012  At 4:20 pm

## 2012-11-28 ENCOUNTER — Encounter (HOSPITAL_COMMUNITY): Payer: Self-pay | Admitting: Obstetrics & Gynecology

## 2015-07-08 LAB — OB RESULTS CONSOLE HIV ANTIBODY (ROUTINE TESTING): HIV: NONREACTIVE

## 2015-07-08 LAB — OB RESULTS CONSOLE HEPATITIS B SURFACE ANTIGEN: Hepatitis B Surface Ag: NEGATIVE

## 2015-07-08 LAB — OB RESULTS CONSOLE RUBELLA ANTIBODY, IGM: Rubella: IMMUNE

## 2015-07-08 LAB — OB RESULTS CONSOLE RPR: RPR: NONREACTIVE

## 2015-07-15 LAB — OB RESULTS CONSOLE GC/CHLAMYDIA
Chlamydia: NEGATIVE
Gonorrhea: NEGATIVE

## 2015-10-17 NOTE — L&D Delivery Note (Signed)
Delivery Note PEC on MgSO4. At 6:22 AM a healthy female was delivered via Vaginal, Spontaneous Delivery (Presentation: ; Occiput Anterior).  APGAR: 9, 9; weight  pending.   Placenta status: Intact, Spontaneous.  Cord: 3 vessels with the following complications: None.  Cord pH: N/A  Anesthesia: Epidural  Episiotomy: None Lacerations: None Suture Repair: N/A Est. Blood Loss (mL): 50  Mom to AICU on MgSO4.  Baby to Couplet care / Skin to Skin. Circ informed consent obtained.  Debroh Sieloff,[name] 01/19/2016, 7:07 AM

## 2016-01-14 LAB — OB RESULTS CONSOLE GBS: STREP GROUP B AG: POSITIVE

## 2016-01-18 ENCOUNTER — Other Ambulatory Visit: Payer: Self-pay | Admitting: Obstetrics & Gynecology

## 2016-01-18 ENCOUNTER — Inpatient Hospital Stay (HOSPITAL_COMMUNITY): Payer: Managed Care, Other (non HMO) | Admitting: Anesthesiology

## 2016-01-18 ENCOUNTER — Inpatient Hospital Stay (HOSPITAL_COMMUNITY)
Admission: RE | Admit: 2016-01-18 | Discharge: 2016-01-21 | DRG: 775 | Disposition: A | Discharge status: Home / Self Care | Payer: Managed Care, Other (non HMO) | Source: Ambulatory Visit | Attending: Obstetrics & Gynecology | Admitting: Obstetrics & Gynecology

## 2016-01-18 ENCOUNTER — Encounter (HOSPITAL_COMMUNITY): Payer: Self-pay

## 2016-01-18 DIAGNOSIS — O1403 Mild to moderate pre-eclampsia, third trimester: Secondary | ICD-10-CM | POA: Diagnosis present

## 2016-01-18 DIAGNOSIS — O9902 Anemia complicating childbirth: Secondary | ICD-10-CM | POA: Diagnosis present

## 2016-01-18 DIAGNOSIS — E039 Hypothyroidism, unspecified: Secondary | ICD-10-CM | POA: Diagnosis present

## 2016-01-18 DIAGNOSIS — O1224 Gestational edema with proteinuria, complicating childbirth: Secondary | ICD-10-CM | POA: Diagnosis present

## 2016-01-18 DIAGNOSIS — O1404 Mild to moderate pre-eclampsia, complicating childbirth: Secondary | ICD-10-CM | POA: Diagnosis present

## 2016-01-18 DIAGNOSIS — O99820 Streptococcus B carrier state complicating pregnancy: Secondary | ICD-10-CM | POA: Diagnosis present

## 2016-01-18 DIAGNOSIS — Z3A37 37 weeks gestation of pregnancy: Secondary | ICD-10-CM

## 2016-01-18 DIAGNOSIS — O99283 Endocrine, nutritional and metabolic diseases complicating pregnancy, third trimester: Secondary | ICD-10-CM | POA: Diagnosis present

## 2016-01-18 DIAGNOSIS — O1205 Gestational edema, complicating the puerperium: Secondary | ICD-10-CM | POA: Diagnosis present

## 2016-01-18 HISTORY — DX: Anemia, unspecified: D64.9

## 2016-01-18 HISTORY — DX: Herpesviral infection, unspecified: B00.9

## 2016-01-18 HISTORY — DX: Hypothyroidism, unspecified: E03.9

## 2016-01-18 HISTORY — DX: Personal history of other infectious and parasitic diseases: Z86.19

## 2016-01-18 HISTORY — DX: Carpal tunnel syndrome, unspecified upper limb: G56.00

## 2016-01-18 HISTORY — DX: Gestational edema, complicating the puerperium: O12.05

## 2016-01-18 LAB — COMPREHENSIVE METABOLIC PANEL
ALK PHOS: 112 U/L (ref 38–126)
ALT: 12 U/L — ABNORMAL LOW (ref 14–54)
ANION GAP: 8 (ref 5–15)
AST: 19 U/L (ref 15–41)
Albumin: 2.7 g/dL — ABNORMAL LOW (ref 3.5–5.0)
BILIRUBIN TOTAL: 0.4 mg/dL (ref 0.3–1.2)
BUN: 5 mg/dL — ABNORMAL LOW (ref 6–20)
CALCIUM: 8.6 mg/dL — AB (ref 8.9–10.3)
CO2: 24 mmol/L (ref 22–32)
Chloride: 107 mmol/L (ref 101–111)
Creatinine, Ser: 0.75 mg/dL (ref 0.44–1.00)
GFR calc non Af Amer: >60 mL/min (ref >60)
Glucose, Bld: 78 mg/dL (ref 65–99)
Potassium: 3.7 mmol/L (ref 3.5–5.1)
Sodium: 139 mmol/L (ref 135–145)
TOTAL PROTEIN: 6.4 g/dL — AB (ref 6.5–8.1)

## 2016-01-18 LAB — CBC
HEMATOCRIT: 34.1 % — AB (ref 36.0–46.0)
Hemoglobin: 10.9 g/dL — ABNORMAL LOW (ref 12.0–15.0)
MCH: 27.3 pg (ref 26.0–34.0)
MCHC: 32 g/dL (ref 30.0–36.0)
MCV: 85.3 fL (ref 78.0–100.0)
Platelets: 191 10*3/uL (ref 150–400)
RBC: 4 MIL/uL (ref 3.87–5.11)
RDW: 15.5 % (ref 11.5–15.5)
WBC: 9.2 10*3/uL (ref 4.0–10.5)

## 2016-01-18 LAB — TYPE AND SCREEN
ABO/RH(D): O POS
Antibody Screen: NEGATIVE

## 2016-01-18 LAB — URIC ACID: URIC ACID, SERUM: 4.9 mg/dL (ref 2.3–6.6)

## 2016-01-18 LAB — LACTATE DEHYDROGENASE: LDH: 160 U/L (ref 98–192)

## 2016-01-18 MED ORDER — PHENYLEPHRINE 40 MCG/ML (10ML) SYRINGE FOR IV PUSH (FOR BLOOD PRESSURE SUPPORT)
80.0000 ug | PREFILLED_SYRINGE | INTRAVENOUS | Status: DC | PRN
  Filled 2016-01-18: qty 2
  Filled 2016-01-18: qty 20

## 2016-01-18 MED ORDER — EPHEDRINE 5 MG/ML INJ
10.0000 mg | INTRAVENOUS | Status: DC | PRN
  Filled 2016-01-18: qty 2

## 2016-01-18 MED ORDER — ACETAMINOPHEN 325 MG PO TABS
650.0000 mg | ORAL_TABLET | ORAL | Status: DC | PRN
Start: 2016-01-18 — End: 2016-01-19
  Administered 2016-01-18: 650 mg via ORAL
  Filled 2016-01-18: qty 2

## 2016-01-18 MED ORDER — DIPHENHYDRAMINE HCL 50 MG/ML IJ SOLN: 12.5000 mg | INTRAMUSCULAR | Status: DC | PRN

## 2016-01-18 MED ORDER — PENICILLIN G POTASSIUM 5000000 UNITS IJ SOLR
2.5000 10*6.[IU] | INTRAVENOUS | Status: DC
  Administered 2016-01-18 – 2016-01-19 (×4): 2.5 10*6.[IU] via INTRAVENOUS
  Filled 2016-01-18 (×7): qty 2.5

## 2016-01-18 MED ORDER — LIDOCAINE HCL (PF) 1 % IJ SOLN
30.0000 mL | INTRAMUSCULAR | Status: DC | PRN
  Filled 2016-01-18: qty 30

## 2016-01-18 MED ORDER — PENICILLIN G POTASSIUM 5000000 UNITS IJ SOLR
5.0000 10*6.[IU] | Freq: Once | INTRAVENOUS | Status: AC
  Administered 2016-01-18: 5 10*6.[IU] via INTRAVENOUS
  Filled 2016-01-18: qty 5

## 2016-01-18 MED ORDER — OXYTOCIN 10 UNIT/ML IJ SOLN: 2.5000 [IU]/h | INTRAVENOUS | Status: DC

## 2016-01-18 MED ORDER — FENTANYL 2.5 MCG/ML BUPIVACAINE 1/10 % EPIDURAL INFUSION (WH - ANES)
14.0000 mL/h | INTRAMUSCULAR | Status: DC | PRN
  Administered 2016-01-18: 12 mL/h via EPIDURAL
  Administered 2016-01-19: 14 mL/h via EPIDURAL
  Filled 2016-01-18 (×2): qty 125

## 2016-01-18 MED ORDER — OXYCODONE-ACETAMINOPHEN 5-325 MG PO TABS: 1.0000 | ORAL_TABLET | ORAL | Status: DC | PRN

## 2016-01-18 MED ORDER — LACTATED RINGERS IV SOLN
500.0000 mL | INTRAVENOUS | Status: DC | PRN
  Administered 2016-01-18: 500 mL via INTRAVENOUS
  Administered 2016-01-19: 250 mL via INTRAVENOUS
  Administered 2016-01-19: 500 mL via INTRAVENOUS

## 2016-01-18 MED ORDER — TERBUTALINE SULFATE 1 MG/ML IJ SOLN: 0.2500 mg | Freq: Once | INTRAMUSCULAR | Status: DC | PRN

## 2016-01-18 MED ORDER — CITRIC ACID-SODIUM CITRATE 334-500 MG/5ML PO SOLN: 30.0000 mL | ORAL | Status: DC | PRN

## 2016-01-18 MED ORDER — OXYCODONE-ACETAMINOPHEN 5-325 MG PO TABS: 2.0000 | ORAL_TABLET | ORAL | Status: DC | PRN

## 2016-01-18 MED ORDER — MAGNESIUM SULFATE 50 % IJ SOLN
2.0000 g/h | INTRAVENOUS | Status: DC
  Administered 2016-01-18: 4 g/h via INTRAVENOUS
  Filled 2016-01-18: qty 80

## 2016-01-18 MED ORDER — OXYTOCIN 10 UNIT/ML IJ SOLN
1.0000 m[IU]/min | INTRAVENOUS | Status: DC
  Administered 2016-01-18: 2 m[IU]/min via INTRAVENOUS
  Filled 2016-01-18: qty 4

## 2016-01-18 MED ORDER — LACTATED RINGERS IV SOLN: 500.0000 mL | Freq: Once | INTRAVENOUS | Status: DC

## 2016-01-18 MED ORDER — MAGNESIUM SULFATE BOLUS VIA INFUSION
4.0000 g | Freq: Once | INTRAVENOUS | Status: DC
  Filled 2016-01-18: qty 500

## 2016-01-18 MED ORDER — LACTATED RINGERS IV SOLN
INTRAVENOUS | Status: DC
  Administered 2016-01-18: 125 mL/h via INTRAVENOUS

## 2016-01-18 MED ORDER — LIDOCAINE HCL (PF) 1 % IJ SOLN
INTRAMUSCULAR | Status: DC | PRN
  Administered 2016-01-18 (×2): 4 mL via EPIDURAL

## 2016-01-18 MED ORDER — PHENYLEPHRINE 40 MCG/ML (10ML) SYRINGE FOR IV PUSH (FOR BLOOD PRESSURE SUPPORT)
80.0000 ug | PREFILLED_SYRINGE | INTRAVENOUS | Status: DC | PRN
  Filled 2016-01-18: qty 2

## 2016-01-18 MED ORDER — ONDANSETRON HCL 4 MG/2ML IJ SOLN
4.0000 mg | Freq: Four times a day (QID) | INTRAMUSCULAR | Status: DC | PRN
  Administered 2016-01-19: 4 mg via INTRAVENOUS
  Filled 2016-01-18: qty 2

## 2016-01-18 MED ORDER — OXYTOCIN BOLUS FROM INFUSION
500.0000 mL | INTRAVENOUS | Status: DC
  Administered 2016-01-19: 500 mL via INTRAVENOUS

## 2016-01-18 NOTE — Anesthesia Procedure Notes (Addendum)
Epidural Patient location during procedure: OB Start time: 01/18/2016 4:02 PM  Staffing Anesthesiologist: Mal AmabileFOSTER, Pang Robers Performed by: anesthesiologist   Preanesthetic Checklist Completed: patient identified, site marked, surgical consent, pre-op evaluation, timeout performed, IV checked, risks and benefits discussed and monitors and equipment checked  Epidural Patient position: sitting Prep: site prepped and draped and DuraPrep Patient monitoring: continuous pulse ox and blood pressure Approach: midline Location: L4-L5 Injection technique: LOR air  Needle:  Needle type: Tuohy  Needle gauge: 17 G Needle length: 9 cm and 9 Needle insertion depth: 7 cm Catheter type: closed end flexible Catheter size: 19 Gauge Catheter at skin depth: 12 cm Test dose: negative and Other  Assessment Events: blood not aspirated, injection not painful, no injection resistance, negative IV test and no paresthesia  Additional Notes Patient identified. Risks and benefits discussed including failed block, incomplete  Pain control, post dural puncture headache, nerve damage, paralysis, blood pressure Changes, nausea, vomiting, reactions to medications-both toxic and allergic and post Partum back pain. All questions were answered. Patient expressed understanding and wished to proceed. Sterile technique was used throughout procedure. Epidural site was Dressed with sterile barrier dressing. No paresthesias, signs of intravascular injection Or signs of intrathecal spread were encountered.  Patient was more comfortable after the epidural was dosed. Please see RN's note for documentation of vital signs and FHR which are stable.

## 2016-01-18 NOTE — Progress Notes (Signed)
Subjective: Doing well, pain controled, UCs 2-3 min  Anesthesia epidural  PEC on MgSO4. Pito Induction.  GBS on Pen G.  Objective: BP 148/94 mmHg  Pulse 76  Temp(Src) 98 F (36.7 C) (Oral)  Resp 20  Ht 5\' 2"  (1.575 m)  Wt 308 lb (139.708 kg)  BMI 56.32 kg/m2  SpO2 98%   FHT:  FHR: 130's bpm, variability: moderate,  accelerations:  Present,  decelerations:  Absent UC:   regular, every 3 minutes VE:   2/60%/Vtx/-3.  AROM clear AF +++   Assessment / Plan: Induction of labor due to preeclampsia,  progressing well on pitocin.  On MgSO4.  Pen G for GBS pos.  Fetal Wellbeing:  Category I Pain Control:  Epidural  Anticipated MOD:  NSVD  Adrienne Cline,Adrienne Cline 01/18/2016, 8:49 PM

## 2016-01-18 NOTE — H&P (Signed)
Adrienne Cline is a 35 y.o. female [phone] presenting for mild PEC based on HBP and Prtnuria at 657.9 mg/24 hrs.  HPI/HPP:  Severe worsening generalized oedema, H/As, Scotomas.  BPs 140-150's/80-100's.  Prtnuria 657.9 mg/24 hrs.  LGA per last US.  GBS pos.  OB History    Gravida Para Term Preterm AB TAB SAB Ectopic Multiple Living   6 3 3  0 2   1  3      Past Medical History  Diagnosis Date  . Asthma   . GERD (gastroesophageal reflux disease)   . Headache(784.0)   . Hypothyroidism   . Anemia   . Herpes simplex     last outbreak 12 years ago  . H/O scarlet fever   . Carpal tunnel syndrome     both wrist   Past Surgical History  Procedure Laterality Date  . Wisdom tooth extraction    . Laparoscopy Left 11/27/2012    Procedure: LAPAROSCOPY OPERATIVE;  Surgeon: Genia DelMarie-Lyne Greysyn Vanderberg, MD;  Location: [facility];  Service: Gynecology;  Laterality: Left;  . Ovarian cyst removal Left 11/27/2012    Procedure: OVARIAN CYSTECTOMY;  Surgeon: Genia DelMarie-Lyne Missael Ferrari, MD;  Location: [facility];  Service: Gynecology;  Laterality: Left;  . Unilateral salpingectomy Left 11/27/2012    Procedure: UNILATERAL SALPINGECTOMY;  Surgeon: Genia DelMarie-Lyne Mikyla Schachter, MD;  Location: [facility];  Service: Gynecology;  Laterality: Left;  . Dilation and curettage of uterus N/A 11/27/2012    Procedure: DILATATION AND CURETTAGE;  Surgeon: Genia DelMarie-Lyne Kalub Morillo, MD;  Location: [facility];  Service: Gynecology;  Laterality: N/A;   Family History: family history is not on file. Social History:  reports that she has never smoked. She does not have any smokeless tobacco history on file. She reports that she does not drink alcohol or use illicit drugs. No Known Allergies  Dilation: 2 Effacement (%): 50 Station: -3 Exam by:: Valentina Lucks. Woods, RN   Membranes intact   Blood pressure 131/77, pulse 80, temperature 98.9 F (37.2 C), temperature source Oral, resp. rate 18, height 5\' 2"  (1.575 m), weight 308 lb (139.708 kg). Exam Physical Exam   No  clonus DTRs 1-2 bilat.  FHR monitoring:  Base line130's with good variability, accelerations present, no deceleration UCs irregular, mild.   PIH Labs today:  Plt 191                            AST/ALT wnl                            Uric Acid 4.9 wnl HPP:  Patient Active Problem List   Diagnosis Date Noted  . Indication for care in labor or delivery 01/18/2016  . CARPAL TUNNEL SYNDROME 01/29/2009  . ALLERGIC RHINITIS, SEASONAL 01/29/2009  . ARTHRITIS 01/29/2009  . COUGH 01/29/2009    Prenatal labs: ABO, Rh: --/--/O POS (04/04 1040) Antibody: NEG (04/04 1040) Rubella: Immune RPR: Nonreactive (09/22 0000)  HBsAg: Negative (09/22 0000)  HIV: Non-reactive (09/22 0000)  Genetic testing: Ultrascreen wnl, AFP1 neg. US anato: wnl 1 hr GTT: wnl at 135 GBS: Positive (03/31 0000)   Assessment/Plan: 37 wks with mild PIH based on PIH with Proteinuria.  PIH labs wnl.  FHR cat 1.  GBS pos/Pen G.  Induction Pitocin.  Epidural PRN.  Will start on MgSO4 in active labor if severe PEC.  Adrienne Cline,[name] 01/18/2016, 12:57 PM

## 2016-01-18 NOTE — Anesthesia Preprocedure Evaluation (Signed)
Anesthesia Evaluation  Patient identified by MRN, date of birth, ID band Patient awake    Reviewed: Allergy & Precautions, Patient's Chart, lab work & pertinent test results  Airway Mallampati: III  TM Distance: >3 FB Neck ROM: Full    Dental no notable dental hx. (+) Teeth Intact   Pulmonary asthma ,    Pulmonary exam normal breath sounds clear to auscultation       Cardiovascular negative cardio ROS Normal cardiovascular exam Rhythm:Regular Rate:Normal     Neuro/Psych  Headaches,  Neuromuscular disease negative psych ROS   GI/Hepatic Neg liver ROS, GERD  Medicated and Controlled,  Endo/Other  Hypothyroidism Morbid obesity  Renal/GU negative Renal ROS  negative genitourinary   Musculoskeletal negative musculoskeletal ROS (+)   Abdominal (+) + obese,   Peds  Hematology  (+) anemia ,   Anesthesia Other Findings   Reproductive/Obstetrics (+) Pregnancy                             Anesthesia Physical Anesthesia Plan  ASA: III  Anesthesia Plan: Epidural   Post-op Pain Management:    Induction:   Airway Management Planned: Natural Airway  Additional Equipment:   Intra-op Plan:   Post-operative Plan:   Informed Consent: I have reviewed the patients History and Physical, chart, labs and discussed the procedure including the risks, benefits and alternatives for the proposed anesthesia with the patient or authorized representative who has indicated his/her understanding and acceptance.     Plan Discussed with: Anesthesiologist  Anesthesia Plan Comments:         Anesthesia Quick Evaluation

## 2016-01-19 ENCOUNTER — Encounter (HOSPITAL_COMMUNITY): Payer: Self-pay

## 2016-01-19 LAB — CBC
HCT: 33.6 % — ABNORMAL LOW (ref 36.0–46.0)
HEMATOCRIT: 33.9 % — AB (ref 36.0–46.0)
HEMOGLOBIN: 10.9 g/dL — AB (ref 12.0–15.0)
Hemoglobin: 11 g/dL — ABNORMAL LOW (ref 12.0–15.0)
MCH: 27.4 pg (ref 26.0–34.0)
MCH: 27.3 pg (ref 26.0–34.0)
MCHC: 32.4 g/dL (ref 30.0–36.0)
MCHC: 32.4 g/dL (ref 30.0–36.0)
MCV: 84.5 fL (ref 78.0–100.0)
MCV: 84 fL (ref 78.0–100.0)
PLATELETS: 193 10*3/uL (ref 150–400)
Platelets: 187 10*3/uL (ref 150–400)
RBC: 4.01 MIL/uL (ref 3.87–5.11)
RBC: 4 MIL/uL (ref 3.87–5.11)
RDW: 15.5 % (ref 11.5–15.5)
RDW: 15.7 % — AB (ref 11.5–15.5)
WBC: 12.4 10*3/uL — ABNORMAL HIGH (ref 4.0–10.5)
WBC: 15.3 10*3/uL — AB (ref 4.0–10.5)

## 2016-01-19 LAB — COMPREHENSIVE METABOLIC PANEL
ALT: 12 U/L — ABNORMAL LOW (ref 14–54)
ANION GAP: 6 (ref 5–15)
AST: 19 U/L (ref 15–41)
Albumin: 2.5 g/dL — ABNORMAL LOW (ref 3.5–5.0)
Alkaline Phosphatase: 118 U/L (ref 38–126)
BILIRUBIN TOTAL: 0.5 mg/dL (ref 0.3–1.2)
BUN: <5 mg/dL — ABNORMAL LOW (ref 6–20)
CALCIUM: 7.8 mg/dL — AB (ref 8.9–10.3)
CO2: 22 mmol/L (ref 22–32)
Chloride: 105 mmol/L (ref 101–111)
Creatinine, Ser: 0.66 mg/dL (ref 0.44–1.00)
GFR calc non Af Amer: >60 mL/min (ref >60)
GLUCOSE: 90 mg/dL (ref 65–99)
Potassium: 3.6 mmol/L (ref 3.5–5.1)
Sodium: 133 mmol/L — ABNORMAL LOW (ref 135–145)
TOTAL PROTEIN: 5.8 g/dL — AB (ref 6.5–8.1)

## 2016-01-19 LAB — RPR: RPR Ser Ql: NONREACTIVE

## 2016-01-19 LAB — LACTATE DEHYDROGENASE: LDH: 165 U/L (ref 98–192)

## 2016-01-19 LAB — PROTEIN / CREATININE RATIO, URINE
Creatinine, Urine: 167 mg/dL
PROTEIN CREATININE RATIO: 2.43 mg/mg{creat} — AB (ref 0.00–0.15)
TOTAL PROTEIN, URINE: 405 mg/dL

## 2016-01-19 LAB — URIC ACID: Uric Acid, Serum: 5.1 mg/dL (ref 2.3–6.6)

## 2016-01-19 MED ORDER — OXYCODONE HCL 5 MG PO TABS: 10.0000 mg | ORAL_TABLET | ORAL | Status: DC | PRN

## 2016-01-19 MED ORDER — IBUPROFEN 600 MG PO TABS
600.0000 mg | ORAL_TABLET | Freq: Four times a day (QID) | ORAL | Status: DC
  Administered 2016-01-19 – 2016-01-21 (×9): 600 mg via ORAL
  Filled 2016-01-19 (×9): qty 1

## 2016-01-19 MED ORDER — LEVOTHYROXINE SODIUM 150 MCG PO TABS
150.0000 ug | ORAL_TABLET | Freq: Every day | ORAL | Status: DC
  Administered 2016-01-19 – 2016-01-21 (×3): 150 ug via ORAL
  Filled 2016-01-19 (×5): qty 1

## 2016-01-19 MED ORDER — OXYTOCIN 10 UNIT/ML IJ SOLN: 2.5000 [IU]/h | INTRAVENOUS | Status: DC | PRN

## 2016-01-19 MED ORDER — ZOLPIDEM TARTRATE 5 MG PO TABS: 5.0000 mg | ORAL_TABLET | Freq: Every evening | ORAL | Status: DC | PRN

## 2016-01-19 MED ORDER — ONDANSETRON HCL 4 MG/2ML IJ SOLN: 4.0000 mg | INTRAMUSCULAR | Status: DC | PRN

## 2016-01-19 MED ORDER — WITCH HAZEL-GLYCERIN EX PADS: 1.0000 "application " | MEDICATED_PAD | CUTANEOUS | Status: DC | PRN

## 2016-01-19 MED ORDER — ONDANSETRON HCL 4 MG PO TABS: 4.0000 mg | ORAL_TABLET | ORAL | Status: DC | PRN

## 2016-01-19 MED ORDER — DIPHENHYDRAMINE HCL 25 MG PO CAPS: 25.0000 mg | ORAL_CAPSULE | Freq: Four times a day (QID) | ORAL | Status: DC | PRN

## 2016-01-19 MED ORDER — PRENATAL MULTIVITAMIN CH
1.0000 | ORAL_TABLET | Freq: Every day | ORAL | Status: DC
  Administered 2016-01-19 – 2016-01-21 (×3): 1 via ORAL
  Filled 2016-01-19 (×3): qty 1

## 2016-01-19 MED ORDER — TETANUS-DIPHTH-ACELL PERTUSSIS 5-2.5-18.5 LF-MCG/0.5 IM SUSP: 0.5000 mL | Freq: Once | INTRAMUSCULAR | Status: DC

## 2016-01-19 MED ORDER — SIMETHICONE 80 MG PO CHEW: 80.0000 mg | CHEWABLE_TABLET | ORAL | Status: DC | PRN

## 2016-01-19 MED ORDER — SENNOSIDES-DOCUSATE SODIUM 8.6-50 MG PO TABS
2.0000 | ORAL_TABLET | ORAL | Status: DC
  Administered 2016-01-19 – 2016-01-20 (×2): 2 via ORAL
  Filled 2016-01-19 (×2): qty 2

## 2016-01-19 MED ORDER — ACETAMINOPHEN 325 MG PO TABS: 650.0000 mg | ORAL_TABLET | ORAL | Status: DC | PRN

## 2016-01-19 MED ORDER — BENZOCAINE-MENTHOL 20-0.5 % EX AERO: 1.0000 "application " | INHALATION_SPRAY | CUTANEOUS | Status: DC | PRN

## 2016-01-19 MED ORDER — LACTATED RINGERS IV SOLN
INTRAVENOUS | Status: DC
  Administered 2016-01-19 (×2): via INTRAVENOUS

## 2016-01-19 MED ORDER — MAGNESIUM SULFATE 50 % IJ SOLN
2.0000 g/h | INTRAVENOUS | Status: AC
  Administered 2016-01-19: 2 g/h via INTRAVENOUS
  Filled 2016-01-19 (×2): qty 80

## 2016-01-19 MED ORDER — LANOLIN HYDROUS EX OINT: TOPICAL_OINTMENT | CUTANEOUS | Status: DC | PRN

## 2016-01-19 MED ORDER — OXYCODONE HCL 5 MG PO TABS: 5.0000 mg | ORAL_TABLET | ORAL | Status: DC | PRN

## 2016-01-19 MED ORDER — DIBUCAINE 1 % RE OINT: 1.0000 "application " | TOPICAL_OINTMENT | RECTAL | Status: DC | PRN

## 2016-01-19 NOTE — Progress Notes (Signed)
This note also relates to the following rows which could not be included: Dose (milli-units/min) Oxytocin - Cannot attach notes to extension rows Rate (mL/hr) Oxytocin - Cannot attach notes to extension rows Concentration Oxytocin - Cannot attach notes to extension rows   Provider at bedside to assess patient, fhr with variables . SVE 8cm, order to increase Pitocin to 3110mu/min regardless of last increase. Postitive Scalp stimulation with SVE by provider.

## 2016-01-19 NOTE — Anesthesia Postprocedure Evaluation (Signed)
Anesthesia Post Note  Patient: Adrienne FarmerJulie E Perez-Flores  Procedure(s) Performed: * No procedures listed *  Patient location during evaluation: [facility] Anesthesia Type: Epidural Level of consciousness: awake, awake and alert, oriented and patient cooperative Pain management: pain level controlled Vital Signs Assessment: post-procedure vital signs reviewed and stable Respiratory status: spontaneous breathing, nonlabored ventilation and respiratory function stable Cardiovascular status: stable Postop Assessment: no headache, no backache, patient able to bend at knees and no signs of nausea or vomiting Anesthetic complications: no    Last Vitals:  Filed Vitals:   01/19/16 1200 01/19/16 1300  BP:    Pulse:    Temp:    Resp: 16 14    Last Pain:  Filed Vitals:   01/19/16 1300  PainSc: Asleep                 [name]

## 2016-01-19 NOTE — Lactation Note (Addendum)
This note was copied from a baby's chart. Lactation Consultation Note  Patient Name: Boy Revonda HumphreyJulie Perez-Flores ZOXWR'UToday's Date: 01/19/2016 Reason for consult: Initial assessment   Initial consult with EXP BF mom of 37w 1d GA infant at 3 hours of age. Mom is on MgSO4 and is on Thyroid medication for hypothyroidism. She was unable to recall BF history very well at this time. She has an 35 yo that she BF for 3 months, 2nd child who is 8 was 37 weeks and was supplemented from the beginning, the 35 yo, she was unsure if she had BF. Mom has fed this infant one bottle of formula. She reports that infant did BF prior to bottle feeding but that she does not have anything in her breasts. Enc mom to BF 8-12 x in 24 hours at first feeding cues and to BF prior to formula feeds.   Reviewed with parent that infant is considered a early term infant, reviewed Late Preterm Infant with mom in regards to minimizing stimulation with infant. Told her infant does not need to be supplemented at this point is he is BF. Reviewed colostrum and supply and demand. Mom wants to supplement infant, reviewed supplementation amounts per LPT infant guidelines with mom. Hand Expression was reviewed but not taught as mom is eating breakfast.   Mom does not have a breast pump at home. Mom without questions at this time. [facility] given, informed mom of LC phone #, BF Support Groups, and IP/OP Services. Offered assistance with latch with feeds, told mom to call desk if assistance needed. Follow up tomorrow and PRN.   Maternal Data Formula Feeding for Exclusion: No Does the patient have breastfeeding experience prior to this delivery?: Yes  Feeding Feeding Type: Formula (similac 19cal) Nipple Type: Slow - flow  LATCH Score/Interventions                      Lactation Tools Discussed/Used WIC Program: No   Consult Status Consult Status: Follow-up Date: 01/20/16 Follow-up type: In-patient    Silas FloodSharon S Zen Cedillos 01/19/2016,  10:23 AM

## 2016-01-20 ENCOUNTER — Encounter (HOSPITAL_COMMUNITY): Payer: Self-pay

## 2016-01-20 DIAGNOSIS — O1205 Gestational edema, complicating the puerperium: Secondary | ICD-10-CM

## 2016-01-20 HISTORY — DX: Gestational edema, complicating the puerperium: O12.05

## 2016-01-20 LAB — COMPREHENSIVE METABOLIC PANEL
ALT: 10 U/L — AB (ref 14–54)
AST: 16 U/L (ref 15–41)
Albumin: 2 g/dL — ABNORMAL LOW (ref 3.5–5.0)
Alkaline Phosphatase: 90 U/L (ref 38–126)
Anion gap: 5 (ref 5–15)
BILIRUBIN TOTAL: 0.1 mg/dL — AB (ref 0.3–1.2)
BUN: 6 mg/dL (ref 6–20)
CO2: 25 mmol/L (ref 22–32)
CREATININE: 0.77 mg/dL (ref 0.44–1.00)
Calcium: 7.3 mg/dL — ABNORMAL LOW (ref 8.9–10.3)
Chloride: 108 mmol/L (ref 101–111)
GFR calc Af Amer: >60 mL/min (ref >60)
GLUCOSE: 103 mg/dL — AB (ref 65–99)
Potassium: 3.7 mmol/L (ref 3.5–5.1)
Sodium: 138 mmol/L (ref 135–145)
TOTAL PROTEIN: 4.7 g/dL — AB (ref 6.5–8.1)

## 2016-01-20 LAB — CBC
HEMATOCRIT: 27.5 % — AB (ref 36.0–46.0)
HEMOGLOBIN: 8.7 g/dL — AB (ref 12.0–15.0)
MCH: 27.2 pg (ref 26.0–34.0)
MCHC: 31.6 g/dL (ref 30.0–36.0)
MCV: 85.9 fL (ref 78.0–100.0)
Platelets: 184 10*3/uL (ref 150–400)
RBC: 3.2 MIL/uL — AB (ref 3.87–5.11)
RDW: 16.1 % — ABNORMAL HIGH (ref 11.5–15.5)
WBC: 10.7 10*3/uL — AB (ref 4.0–10.5)

## 2016-01-20 LAB — LACTATE DEHYDROGENASE: LDH: 183 U/L (ref 98–192)

## 2016-01-20 LAB — URIC ACID: Uric Acid, Serum: 5.5 mg/dL (ref 2.3–6.6)

## 2016-01-20 MED ORDER — HYDROCHLOROTHIAZIDE 25 MG PO TABS
25.0000 mg | ORAL_TABLET | Freq: Every day | ORAL | Status: DC
  Administered 2016-01-20 – 2016-01-21 (×2): 25 mg via ORAL
  Filled 2016-01-20 (×2): qty 1

## 2016-01-20 NOTE — Lactation Note (Signed)
This note was copied from a baby's chart. Lactation Consultation Note Follow up visit made.  Baby is getting formula bottles each feed.  I asked mom if she desired to put her baby to breast.  She states she doesn't have any milk but she latches baby for about 5 minutes prior to bottle.  Reviewed colostrum and milk coming to volume.  Stressed importance of calling for assist when latching baby.  Mom denies questions at present.  Patient Name: Adrienne Revonda HumphreyJulie Perez-Flores [id number]'s Date: 01/20/2016     Maternal Data    Feeding Feeding Type: Bottle Fed - Formula Nipple Type: Slow - flow Length of feed: 5 min  LATCH Score/Interventions                      Lactation Tools Discussed/Used     Consult Status      Huston FoleyMOULDEN, Leeana Creer S 01/20/2016, 12:30 PM

## 2016-01-20 NOTE — Progress Notes (Signed)
Patient ID: Adrienne Cline, female   DOB: 1981/02/10, 35 y.o.   MRN: [id number] PPD # 1 SVD / PEC   S:  Reports feeling very tired with c/o headache since magnesium wearing off.             Tolerating po/ No nausea or vomiting             Bleeding is light             Pain controlled with ibuprofen (OTC)             Up ad lib / ambulatory / voiding without difficulties    Newborn  Information for the patient's newborn:  Perez-Flores, Boy Ayeza [[id number]]  female  breast feeding  / Circumcision planning   O:  A & O x 3, in no apparent distress              VS:  Filed Vitals:   01/20/16 0659 01/20/16 0800 01/20/16 0840 01/20/16 1018  BP:    139/76  Pulse:    113  Temp:    98.2 F (36.8 C)  TempSrc:    Oral  Resp: Height:      Weight:      SpO2: 96%   97%    LABS:  Recent Labs  01/19/16 0741 01/20/16 0554  WBC 15.3* 10.7*  HGB 10.9* 8.7*  HCT 33.6* 27.5*  PLT 193 184   Results for orders placed or performed during the hospital encounter of 01/18/16 (from the past 24 hour(s))  CBC     Status: Abnormal   Collection Time: 01/20/16  5:54 AM  Result Value Ref Range   WBC 10.7 (H) 4.0 - 10.5 K/uL   RBC 3.20 (L) 3.87 - 5.11 MIL/uL   Hemoglobin 8.7 (L) 12.0 - 15.0 g/dL   HCT 78.2 (L) 95.6 - 21.3 %   MCV 85.9 78.0 - 100.0 fL   MCH 27.2 26.0 - 34.0 pg   MCHC 31.6 30.0 - 36.0 g/dL   RDW 08.6 (H) 57.8 - 46.9 %   Platelets 184 150 - 400 K/uL  Comprehensive metabolic panel     Status: Abnormal   Collection Time: 01/20/16  5:54 AM  Result Value Ref Range   Sodium 138 135 - 145 mmol/L   Potassium 3.7 3.5 - 5.1 mmol/L   Chloride 108 101 - 111 mmol/L   CO2 25 22 - 32 mmol/L   Glucose, Bld 103 (H) 65 - 99 mg/dL   BUN 6 6 - 20 mg/dL   Creatinine, Ser 6.29 0.44 - 1.00 mg/dL   Calcium 7.3 (L) 8.9 - 10.3 mg/dL   Total Protein 4.7 (L) 6.5 - 8.1 g/dL   Albumin 2.0 (L) 3.5 - 5.0 g/dL   AST 16 15 - 41 U/L   ALT 10 (L) 14 - 54 U/L   Alkaline Phosphatase 90 38 - 126  U/L   Total Bilirubin 0.1 (L) 0.3 - 1.2 mg/dL   GFR calc non Af Amer >60 >60 mL/min   GFR calc Af Amer >60 >60 mL/min   Anion gap 5 5 - 15  Lactate dehydrogenase     Status: None   Collection Time: 01/20/16  5:54 AM  Result Value Ref Range   LDH 183 98 - 192 U/L  Uric acid     Status: None   Collection Time: 01/20/16  5:54 AM  Result Value Ref Range   Uric Acid, Serum 5.5  2.3 - 6.6 mg/dL    Blood type: --/--/O POS (04/04 1040)  Rubella: Immune (09/22 0000)   I&O: I/O last 3 completed shifts: In: 7366.3 [P.O.:3630; I.V.:3736.3] Out: 4350 [Urine:4250; Emesis/NG output:50; Blood:50]          Total I/O In: 166.7 [I.V.:166.7] Out: 350 [Urine:350]  Lungs: Clear and unlabored  Heart: regular rate and rhythm / no murmurs  Abdomen: soft, non-tender, non-distended             Fundus: firm, non-tender, U-1  Perineum: intact  Lochia: minimal  Extremities: 4+ pitting edema in BLE up to upper thigh, no calf pain or tenderness, No Homans    A/P: PPD # 1  35 y.o., [id number]   Principal Problem:    Postpartum care following vaginal delivery (4/5)  Active Problems:    PEC, delivered - stable    Gestational edema affecting puerperium   Doing well - stable status  Routine post partum orders  Start HCT 25 mg daily / increase water intake / limit sodium intake / elevate BLE as much as possible  Anticipate discharge tomorrow    Raelyn MoraAWSON, Jilliam Bellmore, M, MSN, CNM 01/20/2016, 10:50 AM

## 2016-01-21 MED ORDER — POLYSACCHARIDE IRON COMPLEX 150 MG PO CAPS: 150.0000 mg | ORAL_CAPSULE | Freq: Every day | ORAL | Status: DC

## 2016-01-21 MED ORDER — OXYCODONE HCL 5 MG PO TABS: 5.0000 mg | ORAL_TABLET | ORAL | Status: DC | PRN

## 2016-01-21 MED ORDER — HYDROCHLOROTHIAZIDE 25 MG PO TABS: 25.0000 mg | ORAL_TABLET | Freq: Every day | ORAL | Status: DC

## 2016-01-21 MED ORDER — MAGNESIUM OXIDE 400 MG PO TABS: 400.0000 mg | ORAL_TABLET | Freq: Every day | ORAL | Status: DC

## 2016-01-21 MED ORDER — FUROSEMIDE 10 MG/ML IJ SOLN
20.0000 mg | Freq: Once | INTRAMUSCULAR | Status: AC
  Administered 2016-01-21: 20 mg via INTRAVENOUS
  Filled 2016-01-21: qty 2

## 2016-01-21 MED ORDER — IBUPROFEN 600 MG PO TABS: 600.0000 mg | ORAL_TABLET | Freq: Four times a day (QID) | ORAL | Status: DC

## 2016-01-21 NOTE — Discharge Summary (Signed)
Obstetric Discharge Summary  Reason for Admission: induction of labor - worsenign edema and mild pre-eclampsia Prenatal Procedures: ultrasound Intrapartum Procedures: spontaneous vaginal delivery and GBS prophylaxis Postpartum Procedures:  magnesium sulfate prophylaxis x 24 hours postpartum Complications-Operative and Postpartum: dependent edema HEMOGLOBIN  Date Value Ref Range Status  01/20/2016 8.7* 12.0 - 15.0 g/dL Final    Comment:    REPEATED TO VERIFY DELTA CHECK NOTED    HCT  Date Value Ref Range Status  01/20/2016 27.5* 36.0 - 46.0 % Final    Physical Exam:  General: alert, cooperative and no distress Lochia: appropriate Uterine Fundus: firm Perineum -intact DVT Evaluation: No evidence of DVT seen on physical exam.  Discharge Diagnoses: Term Pregnancy-delivered, Preelampsia and dependent edema and IDA of pregnancy  Discharge Information: Date: 01/21/2016 Activity: pelvic rest Diet: routine - low salt and more water intake Medications: PNV, Ibuprofen, Percocet and HCTZ 25mg  daily Condition: stable Instructions: refer to practice specific booklet Discharge to: home Follow-up Information    Follow up with LAVOIE,[name], MD. Schedule an appointment as soon as possible for a visit in 1 week.   Specialty:  Obstetrics and Gynecology   Contact information:   [address] [place] [address] [phone]       Newborn Data: Live born female  Birth Weight: 6 lb 10.7 oz (3025 g) APGAR: 9, 9  Home with mother.  Marlinda MikeBAILEY, Kellen Hover 01/21/2016, 10:52 AM

## 2016-01-21 NOTE — Progress Notes (Signed)
PPD 2 SVD - no repair / PEC off magnesium  S:  Reports feeling well except for severe edema - request dose of lasix today              NO PIH symptoms             Tolerating po/ No nausea or vomiting             Bleeding is light             Pain controlled with motrin only             Up ad lib / ambulatory / voiding QS  Newborn breast feeding   O:               VS: BP 137/84 mmHg  Pulse 86  Temp(Src) 98 F (36.7 C) (Oral)  Resp 16  Ht 5\' 2"  (1.575 m)  Wt 141.522 kg (312 lb)  BMI 57.05 kg/m2  SpO2 97%  Breastfeeding? Unknown              BP: 137/84 - 132/80 - 137/68- 139/65 - 139/76   LABS:              Recent Labs  01/19/16 0741 01/20/16 0554  WBC 15.3* 10.7*  HGB 10.9* 8.7*  PLT 193 184               Blood type: --/--/O POS (04/04 1040)  Rubella: Immune (09/22 0000)                     I&O: net positive               Physical Exam:             Alert and oriented X3 / generalized edema  Lungs: Clear and unlabored  Heart: regular rate and rhythm / no mumurs  Abdomen: soft, non-tender, non-distended              Fundus: firm, non-tender, Ueven  Perineum: mild edema  Lochia: light  Extremities: 3+ pitting edema to mid-thigh, no calf pain or tenderness    A: PPD # 2             Pre-eclampsia             Dependent edema             IDA of pregnancy   Doing well - stable status  P: Routine post partum orders  Continue iron and magnesium             Lasix 20mg  IV dose today then increase HCTZ to 25 mg daily             Office recheck next week  Marlinda MikeBAILEY, Khiara Shuping CNM, MSN, [facility] 01/21/2016, 8:06 AM

## 2016-01-21 NOTE — Lactation Note (Signed)
This note was copied from a baby's chart. Lactation Consultation Note  Patient Name: Adrienne Revonda HumphreyJulie Perez-Flores [id number]'s Date: 01/21/2016 Reason for consult: Follow-up assessment  Baby 52 hours old. Mom reports that her legs and hands are tight with fluid, and she just had a 1 time dose of Lasix which is "working" as mom reports that she is voiding well. Mom states that she would like to primarily BF when her milk "comes in." Discussed with mom the need to have baby at breast and/or pump with each feeding in order to have a good breast milk supply. Mom reports that she has DEBP at home. Enc mom to put baby to breast with each feeding, then supplement with EBM/formul according to supplementation guidelines which were given with review, and then post-pump for 15 minutes in order to protect her breast milk supply. Mom aware of OP/BFSG and LC phone line assistance after D/C.  Maternal Data    Feeding Feeding Type: Breast Fed Length of feed: 5 min  LATCH Score/Interventions                      Lactation Tools Discussed/Used     Consult Status Consult Status: PRN    Geralynn OchsWILLIARD, Elissia Spiewak 01/21/2016, 10:45 AM

## 2016-02-08 ENCOUNTER — Inpatient Hospital Stay (HOSPITAL_COMMUNITY)
Admission: AD | Admit: 2016-02-08 | Payer: Managed Care, Other (non HMO) | Source: Ambulatory Visit | Admitting: Obstetrics & Gynecology

## 2016-03-23 ENCOUNTER — Ambulatory Visit (INDEPENDENT_AMBULATORY_CARE_PROVIDER_SITE_OTHER): Payer: Managed Care, Other (non HMO) | Admitting: Neurology

## 2016-03-23 ENCOUNTER — Encounter: Payer: Self-pay | Admitting: Neurology

## 2016-03-23 DIAGNOSIS — R51 Headache: Secondary | ICD-10-CM | POA: Diagnosis not present

## 2016-03-23 DIAGNOSIS — H539 Unspecified visual disturbance: Secondary | ICD-10-CM

## 2016-03-23 DIAGNOSIS — R519 Headache, unspecified: Secondary | ICD-10-CM | POA: Insufficient documentation

## 2016-03-23 MED ORDER — PROPRANOLOL HCL 60 MG PO TABS: 60.0000 mg | ORAL_TABLET | Freq: Two times a day (BID) | ORAL | Status: DC

## 2016-03-23 MED ORDER — RIZATRIPTAN BENZOATE 5 MG PO TBDP: 5.0000 mg | ORAL_TABLET | ORAL | Status: DC | PRN

## 2016-03-23 NOTE — Progress Notes (Signed)
PATIENT: Adrienne Cline DOB: 05/21/1981  Chief Complaint  Patient presents with  . Headache    She is having headaches nearly every day.  Reports light sensitiviy, blurred vision and floaters.  She uses Tylenol or Ibuprofen but only for the more severe headaches.  She delivered her fourth child on 01/19/16.  She was diagnosed with preeclampsia during her pregnancy.     HISTORICAL  Adrienne Cline is a 35 years old right-handed female seen in refer by Obstetrician Dr.  Genia DelMarie-Lyne Lavoie for evaluation of daily headache in March 23 2016  She reported a history of occasionally headaches since high school, gradually getting worse since childbirth around 2006, still only couple times each months, lateralized retro-orbital area pressure headaches, there was no significant light noise sensitivity,  During her most recent pregnancy and delivery, she experienced worsening headache, she had induced vaginal delivery in April fifth 2017, her boy Joelene MillinOliver is 37 weeks, around third trimester, she developed rapid weight gain, fluid retention, elevated blood pressure, highest she could remember was more than 200/150  At the same time, since February 2017, she noticed frequent headaches, bilateral retro-orbital area, severe pressure, lasting for hours to days, with associated blurry vision, seeing Halo around objects, since delivery, her headache is getting worse, she is now having 5 headaches each week, she has been taking ibuprofen 800 mg with limited help, worsening blurry vision, difficulty focusing,  She denied lateralized motor or sensory deficit. Blood pressure today is 110/70, with heart rate of 80  I reviewed laboratory evaluation April 2017, mild anemia hemoglobin 8.7, CMP showed normal creatinine  REVIEW OF SYSTEMS: Full 14 system review of systems performed and notable only for Weight gain, fatigue, swelling in legs, spinning sensation, rash, blurred vision, eye pain, shortness of breath,  cough, wheezing, snoring, anemia, feeling hot, flushing, joint pain, swelling, cramps, achy muscles, skin sensitivity, headaches, numbness, dizziness, snoring, restless leg, depression, anxiety, not enough sleep, decreased energy  ALLERGIES: No Known Allergies  HOME MEDICATIONS: Current Outpatient Prescriptions  Medication Sig Dispense Refill  . esomeprazole (NEXIUM) 40 MG capsule Take 40 mg by mouth daily at 12 noon.    . etonogestrel (NEXPLANON) 68 MG IMPL implant 1 each by Subdermal route once.    Marland Kitchen. levothyroxine (SYNTHROID, LEVOTHROID) 150 MCG tablet Take 150 mcg by mouth daily before breakfast.     No current facility-administered medications for this visit.    PAST MEDICAL HISTORY: Past Medical History  Diagnosis Date  . Asthma   . GERD (gastroesophageal reflux disease)   . Headache(784.0)   . Hypothyroidism   . Anemia   . Herpes simplex     last outbreak 12 years ago  . H/O scarlet fever   . Carpal tunnel syndrome     both wrist  . Postpartum care following vaginal delivery (4/5) 01/20/2016  . Gestational edema affecting puerperium 01/20/2016  . Headache     PAST SURGICAL HISTORY: Past Surgical History  Procedure Laterality Date  . Wisdom tooth extraction    . Laparoscopy Left 11/27/2012    Procedure: LAPAROSCOPY OPERATIVE;  Surgeon: Genia DelMarie-Lyne Lavoie, MD;  Location: [facility];  Service: Gynecology;  Laterality: Left;  . Ovarian cyst removal Left 11/27/2012    Procedure: OVARIAN CYSTECTOMY;  Surgeon: Genia DelMarie-Lyne Lavoie, MD;  Location: [facility];  Service: Gynecology;  Laterality: Left;  . Unilateral salpingectomy Left 11/27/2012    Procedure: UNILATERAL SALPINGECTOMY;  Surgeon: Genia DelMarie-Lyne Lavoie, MD;  Location: [facility];  Service: Gynecology;  Laterality: Left;  .  Dilation and curettage of uterus N/A 11/27/2012    Procedure: DILATATION AND CURETTAGE;  Surgeon: Genia Del, MD;  Location: [facility];  Service: Gynecology;  Laterality: N/A;    FAMILY HISTORY: Family History    Problem Relation Age of Onset  . Migraines Mother     SOCIAL HISTORY:  Social History   Social History  . Marital Status: Married    Spouse Name: N/A  . Number of Children: 4  . Years of Education: Some Coll   Occupational History  . Homemaker    Social History Main Topics  . Smoking status: Never Smoker   . Smokeless tobacco: Not on file  . Alcohol Use: No     Comment: occasionally  . Drug Use: No  . Sexual Activity: Yes    Birth Control/ Protection: None   Other Topics Concern  . Not on file   Social History Narrative   Lives at home with husband and children.   Right-handed.   3 cups caffeine per day.     PHYSICAL EXAM   Filed Vitals:   03/23/16 0941  BP: 109/72  Pulse: 83  Height: 5\' 2"  (1.575 m)  Weight: 263 lb 4 oz (119.409 kg)    Not recorded      Body mass index is 48.14 kg/(m^2).  PHYSICAL EXAMNIATION:  Gen: NAD, conversant, well nourised, obese, well groomed                     Cardiovascular: Regular rate rhythm, no peripheral edema, warm, nontender. Eyes: Conjunctivae clear without exudates or hemorrhage Neck: Supple, no carotid bruise. Pulmonary: Clear to auscultation bilaterally   NEUROLOGICAL EXAM:  MENTAL STATUS: Speech:    Speech is normal; fluent and spontaneous with normal comprehension.  Cognition:     Orientation to time, place and person     Normal recent and remote memory     Normal Attention span and concentration     Normal Language, naming, repeating,spontaneous speech     Fund of knowledge   CRANIAL NERVES: CN II: Visual fields are full to confrontation. Fundoscopic exam is normal with sharp discs and no vascular changes. Pupils are round equal and briskly reactive to light. CN III, IV, VI: extraocular movement are normal. No ptosis. CN V: Facial sensation is intact to pinprick in all 3 divisions bilaterally. Corneal responses are intact.  CN VII: Face is symmetric with normal eye closure and smile. CN VIII:  Hearing is normal to rubbing fingers CN IX, X: Palate elevates symmetrically. Phonation is normal. CN XI: Head turning and shoulder shrug are intact CN XII: Tongue is midline with normal movements and no atrophy.  MOTOR: There is no pronator drift of out-stretched arms. Muscle bulk and tone are normal. Muscle strength is normal.  REFLEXES: Reflexes are 2+ and symmetric at the biceps, triceps, knees, and ankles. Plantar responses are flexor.  SENSORY: Intact to light touch, pinprick, positional sensation and vibratory sensation are intact in fingers and toes.  COORDINATION: Rapid alternating movements and fine finger movements are intact. There is no dysmetria on finger-to-nose and heel-knee-shin.    GAIT/STANCE: Posture is normal. Gait is steady with normal steps, base, arm swing, and turning. Heel and toe walking are normal. Tandem gait is normal.  Romberg is absent.   DIAGNOSTIC DATA (LABS, IMAGING, TESTING) - I reviewed patient records, labs, notes, testing and imaging myself where available.   ASSESSMENT AND PLAN  Jerra Huckeby Cline is a 35 y.o. female  Worsening headaches post partum with visual disturbance, history of preeclampsia  Most consistent with migraine headaches,  Need to rule out venous thrombosis, proceed with MRI of the brain, MRV of brain  Propanolol 60 mg twice a day as migraine prevention  Maxalt as needed   Levert Feinstein, M.D. Ph.D.  [facility] [address] [place], [place] [address] Ph: [phone] Fax: [phone]  CC: Genia Del, MD

## 2016-04-03 ENCOUNTER — Ambulatory Visit
Admission: RE | Admit: 2016-04-03 | Discharge: 2016-04-03 | Disposition: A | Discharge status: Home / Self Care | Payer: Managed Care, Other (non HMO) | Source: Ambulatory Visit | Attending: Neurology | Admitting: Neurology

## 2016-04-03 DIAGNOSIS — H539 Unspecified visual disturbance: Secondary | ICD-10-CM | POA: Diagnosis not present

## 2016-04-03 DIAGNOSIS — R51 Headache: Secondary | ICD-10-CM | POA: Diagnosis not present

## 2016-04-03 DIAGNOSIS — R519 Headache, unspecified: Secondary | ICD-10-CM

## 2016-04-03 MED ORDER — GADOBENATE DIMEGLUMINE 529 MG/ML IV SOLN
20.0000 mL | Freq: Once | INTRAVENOUS | Status: AC | PRN
  Administered 2016-04-03: 20 mL via INTRAVENOUS

## 2016-04-11 ENCOUNTER — Telehealth: Payer: Self-pay | Admitting: *Deleted

## 2016-04-11 ENCOUNTER — Ambulatory Visit: Payer: Managed Care, Other (non HMO) | Admitting: Neurology

## 2016-04-11 NOTE — Telephone Encounter (Signed)
No showed follow up appointment. 

## 2016-04-12 ENCOUNTER — Encounter: Payer: Self-pay | Admitting: Neurology

## 2018-05-24 ENCOUNTER — Encounter: Payer: Self-pay | Admitting: Obstetrics & Gynecology

## 2018-05-24 ENCOUNTER — Ambulatory Visit (INDEPENDENT_AMBULATORY_CARE_PROVIDER_SITE_OTHER): Payer: 59 | Admitting: Obstetrics & Gynecology

## 2018-05-24 VITALS — BP 136/88 | Ht 62.0 in | Wt 294.0 lb

## 2018-05-24 DIAGNOSIS — B3731 Acute candidiasis of vulva and vagina: Secondary | ICD-10-CM

## 2018-05-24 DIAGNOSIS — Z978 Presence of other specified devices: Secondary | ICD-10-CM

## 2018-05-24 DIAGNOSIS — R102 Pelvic and perineal pain: Secondary | ICD-10-CM

## 2018-05-24 DIAGNOSIS — N760 Acute vaginitis: Secondary | ICD-10-CM

## 2018-05-24 DIAGNOSIS — F419 Anxiety disorder, unspecified: Secondary | ICD-10-CM | POA: Diagnosis not present

## 2018-05-24 DIAGNOSIS — B373 Candidiasis of vulva and vagina: Secondary | ICD-10-CM

## 2018-05-24 DIAGNOSIS — Z975 Presence of (intrauterine) contraceptive device: Secondary | ICD-10-CM

## 2018-05-24 DIAGNOSIS — N921 Excessive and frequent menstruation with irregular cycle: Secondary | ICD-10-CM

## 2018-05-24 DIAGNOSIS — Z0189 Encounter for other specified special examinations: Secondary | ICD-10-CM

## 2018-05-24 DIAGNOSIS — N762 Acute vulvitis: Secondary | ICD-10-CM | POA: Diagnosis not present

## 2018-05-24 DIAGNOSIS — B9689 Other specified bacterial agents as the cause of diseases classified elsewhere: Secondary | ICD-10-CM

## 2018-05-24 LAB — WET PREP FOR TRICH, YEAST, CLUE

## 2018-05-24 MED ORDER — FLUCONAZOLE 150 MG PO TABS: 150.0000 mg | ORAL_TABLET | Freq: Every day | ORAL | 3 refills | Status: AC

## 2018-05-24 MED ORDER — SERTRALINE HCL 50 MG PO TABS: 50.0000 mg | ORAL_TABLET | Freq: Every day | ORAL | 3 refills | Status: DC

## 2018-05-24 MED ORDER — NORETHIN ACE-ETH ESTRAD-FE 1-20 MG-MCG(24) PO TABS: 1.0000 | ORAL_TABLET | Freq: Every day | ORAL | 3 refills | Status: DC

## 2018-05-24 MED ORDER — TINIDAZOLE 500 MG PO TABS: 2.0000 g | ORAL_TABLET | Freq: Every day | ORAL | 0 refills | Status: AC

## 2018-05-24 MED ORDER — TERCONAZOLE 0.8 % VA CREA: 1.0000 | TOPICAL_CREAM | Freq: Every day | VAGINAL | 2 refills | Status: AC

## 2018-05-24 NOTE — Patient Instructions (Signed)
1. Acute vulvitis Probable yeast vulvitis also involving the inguinal areas bilaterally.  Will treat with fluconazole as well as terconazole as needed.  2. Bacterial vaginosis Bacterial vaginosis confirmed by wet prep.  Will start on tinidazole.  Usage reviewed with patient and prescription sent to pharmacy.  3. Yeast vaginitis Treat with fluconazole 150 mg 1 tablet daily for 3 days and given the frequent recurrences, will attempt prevention with 1 tablet weekly for 3 additional weeks.    4. Anxiety Zoloft 50 mg p.o. daily represcribed.    5. Pelvic pain in female Left intermittent pelvic pain with previous negative work-up.  Will reassess by pelvic ultrasound to rule out ovarian pathology. - US Transvaginal Non-OB; Future  6. Breakthrough bleeding on Nexplanon Very frequent breakthrough bleeding on Nexplanon.  Will attempt control with a low-dose birth control pill Loestrin 24 FE 1/20.  Usage, risks and benefits reviewed with patient.  Will reassess contraception at her annual gynecologic exam.  7. Encounter for laboratory test Will follow-up for Fasting Health labs - CBC; Future - Comp Met (CMET); Future - Lipid panel; Future - TSH; Future - VITAMIN D 25 Hydroxy (Vit-D Deficiency, Fractures); Future  Other orders - tinidazole (TINDAMAX) 500 MG tablet; Take 4 tablets (2,000 mg total) by mouth daily for 2 days. - fluconazole (DIFLUCAN) 150 MG tablet; Take 1 tablet (150 mg total) by mouth daily for 3 days. Then take 1 tab weekly x 3. - terconazole (TERAZOL 3) 0.8 % vaginal cream; Place 1 applicator vaginally at bedtime for 3 days. - sertraline (ZOLOFT) 50 MG tablet; Take 1 tablet (50 mg total) by mouth daily. - Norethindrone Acetate-Ethinyl Estrad-FE (LOESTRIN 24 FE) 1-20 MG-MCG(24) tablet; Take 1 tablet by mouth daily.  Adrienne Cline, it was a pleasure seeing you today!

## 2018-05-24 NOTE — Progress Notes (Signed)
Adrienne Cline 1981-03-25 [id number]        37 y.o.  [id number]   RP: Vaginal discharge with irritation and itching at vulva  HPI: C/O vaginal and vulvar irritation with itching, burning and increased vaginal discharge on-off x many months.  BTB frequently, most days brownish bleeding on Nexplanon.  Left pelvic pain on-off x many months.  CT Lumbar and pelvis not explaining the pain Sx.  Anxiety on Zoloft.  Obesity BMI 53.77.  Hypothyroidism on Armour-Thyroid.   OB History  Gravida Para Term Preterm AB Living  '6 4 4 ' 0 2 4  SAB TAB Ectopic Multiple Live Births      1 0 1    # Outcome Date GA Lbr Len/2nd Weight Sex Delivery Anes PTL Lv  6 Term 01/19/16 72w1d09:26 / 00:11 6 lb 10.7 oz (3.025 kg) M Vag-Spont EPI  LIV  5 AB           4 Ectopic           3 Term           2 Term           1 Term             Obstetric Comments  1 stepson     Past medical history,surgical history, problem list, medications, allergies, family history and social history were all reviewed and documented in the EPIC chart.   Directed ROS with pertinent positives and negatives documented in the history of present illness/assessment and plan.  Exam:  Vitals:   05/24/18 0953  BP: 136/88  Weight: 294 lb (133.4 kg)  Height: '5\' 2"'  (1.575 m)   General appearance:  Normal  Abdomen: Normal/Obese  Gynecologic exam: Vulva:  Generalized vulvitis with erythema at inguinal areas bilaterally.  Discharge at vulva c/w yeast.  Speculum:  Cervix normal/Vagina normal.  Increased vaginal discharge.  Wet prep done.  Bimanual exam:  Uterus AV, normal volume, mobile, NT.  No adnexal mass, NT on Rt, mildly Tender on Lt side.  Wet prep: Clue cells present  Assessment/Plan:  37y.o. [id number]  1. Acute vulvitis Probable yeast vulvitis also involving the inguinal areas bilaterally.  Will treat with fluconazole as well as terconazole as needed.  2. Bacterial vaginosis Bacterial vaginosis confirmed by wet prep.   Will start on tinidazole.  Usage reviewed with patient and prescription sent to pharmacy.  3. Yeast vaginitis Treat with fluconazole 150 mg 1 tablet daily for 3 days and given the frequent recurrences, will attempt prevention with 1 tablet weekly for 3 additional weeks.    4. Anxiety Zoloft 50 mg p.o. daily represcribed.    5. Pelvic pain in female Left intermittent pelvic pain with previous negative work-up.  Will reassess by pelvic ultrasound to rule out ovarian pathology. - [place]Transvaginal Non-OB; Future  6. Breakthrough bleeding on Nexplanon Very frequent breakthrough bleeding on Nexplanon.  Will attempt control with a low-dose birth control pill Loestrin 24 FE 1/20.  Usage, risks and benefits reviewed with patient.  Will reassess contraception at her annual gynecologic exam.  7. Encounter for laboratory test Will follow-up for Fasting Health labs - CBC; Future - Comp Met (CMET); Future - Lipid panel; Future - TSH; Future - VITAMIN D 25 Hydroxy (Vit-D Deficiency, Fractures); Future  Other orders - tinidazole (TINDAMAX) 500 MG tablet; Take 4 tablets (2,000 mg total) by mouth daily for 2 days. - fluconazole (DIFLUCAN) 150 MG tablet; Take 1 tablet (150 mg  total) by mouth daily for 3 days. Then take 1 tab weekly x 3. - terconazole (TERAZOL 3) 0.8 % vaginal cream; Place 1 applicator vaginally at bedtime for 3 days. - sertraline (ZOLOFT) 50 MG tablet; Take 1 tablet (50 mg total) by mouth daily. - Norethindrone Acetate-Ethinyl Estrad-FE (LOESTRIN 24 FE) 1-20 MG-MCG(24) tablet; Take 1 tablet by mouth daily.  Counseling on above issues and coordination of care more than 50% for 25 minutes.  Princess Bruins MD, 10:04 AM 05/24/2018

## 2018-05-30 ENCOUNTER — Telehealth: Payer: Self-pay | Admitting: *Deleted

## 2018-05-30 NOTE — Telephone Encounter (Signed)
Yes to retreat BV, but recommend changing to Metrogel x 5 nights and Fluconazole 1 tab.  Agree with prescription of Valacyclovir 1 g PO daily x 5 days.  #30, refill x 1.

## 2018-05-30 NOTE — Telephone Encounter (Signed)
Patient was seen on 05/24/18 treated for BV and yeast, completed all medication and still has symptoms. 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 Please advise

## 2018-05-31 MED ORDER — FLUCONAZOLE 150 MG PO TABS: 150.0000 mg | ORAL_TABLET | Freq: Once | ORAL | 0 refills | Status: AC

## 2018-05-31 MED ORDER — METRONIDAZOLE 0.75 % VA GEL: 1.0000 | Freq: Every day | VAGINAL | 0 refills | Status: DC

## 2018-05-31 MED ORDER — VALACYCLOVIR HCL 1 G PO TABS: ORAL_TABLET | ORAL | 1 refills | Status: DC

## 2018-05-31 NOTE — Telephone Encounter (Signed)
Patient aware, Rx sent.  

## 2018-07-04 ENCOUNTER — Other Ambulatory Visit: Payer: Self-pay | Admitting: Obstetrics & Gynecology

## 2018-07-04 ENCOUNTER — Encounter: Payer: Self-pay | Admitting: Obstetrics & Gynecology

## 2018-07-04 ENCOUNTER — Ambulatory Visit (INDEPENDENT_AMBULATORY_CARE_PROVIDER_SITE_OTHER): Payer: 59 | Admitting: Obstetrics & Gynecology

## 2018-07-04 ENCOUNTER — Ambulatory Visit (INDEPENDENT_AMBULATORY_CARE_PROVIDER_SITE_OTHER): Payer: 59

## 2018-07-04 VITALS — BP 110/70

## 2018-07-04 DIAGNOSIS — R102 Pelvic and perineal pain: Secondary | ICD-10-CM

## 2018-07-04 DIAGNOSIS — G8929 Other chronic pain: Secondary | ICD-10-CM | POA: Diagnosis not present

## 2018-07-04 DIAGNOSIS — R1032 Left lower quadrant pain: Secondary | ICD-10-CM

## 2018-07-04 DIAGNOSIS — N83202 Unspecified ovarian cyst, left side: Secondary | ICD-10-CM

## 2018-07-04 NOTE — Progress Notes (Signed)
    Adrienne FarmerJulie E Cline 11/13/1980 [id number]        37 y.o.  [id number] Married  RP: LLQ pain for Pelvic US  HPI: Intermittent LLQ pain.  Worse when changing position.  CT pelvis negative with Fam MD.   OB History  Gravida Para Term Preterm AB Living  6 4 4  0 2 4  SAB TAB Ectopic Multiple Live Births      1 0 1    # Outcome Date GA Lbr Len/2nd Weight Sex Delivery Anes PTL Lv  6 Term 01/19/16 6335w1d 09:26 / 00:11 6 lb 10.7 oz (3.025 kg) M Vag-Spont EPI  LIV  5 AB           4 Ectopic           3 Term           2 Term           1 Term             Obstetric Comments  1 stepson     Past medical history,surgical history, problem list, medications, allergies, family history and social history were all reviewed and documented in the EPIC chart.   Directed ROS with pertinent positives and negatives documented in the history of present illness/assessment and plan.  Exam:  Vitals:   07/04/18 1111  BP: 110/70   General appearance:  Normal  Abdomen: No hernia felt, but tender LL abdominal wall when lifting head/chest.  Pelvic US today: T/V and T/a images.  No uterine abnormality seen.  The uterus measures 10.09 x 5.84 x 4.39 cm.  The endometrial lining is thin and normal at 2.7 mm.  Right ovary appears normal.  Left ovarian thin-walled avascular cyst measuring 2.5 x 2.2 x 1.8 cm.  No free fluid in the posterior cul-de-sac.   Assessment/Plan:  37 y.o. [id number]   1. Chronic LLQ pain Pelvic ultrasound findings reviewed with patient.  Reassured that no gynecologic pathology is present.  Given that there was no finding to explain her left lower abdominal pains, and abdominal exam to rule out any hernia was performed.  No midline ventral hernia, no evidence of hernia in the inguinal area or femoral area.  Although no hernia was felt, patient was tender at the left lower abdominal wall when lifting the head and chest.  Patient will pursue investigation with her family physician as needed,  possible evaluation with a general surgeon.  Counseling on above issues and coordination of care more than 50% for 15 minutes.  Genia DelMarie-Lyne Remmie Bembenek MD, 11:49 AM 07/04/2018

## 2018-07-06 ENCOUNTER — Encounter: Payer: Self-pay | Admitting: Obstetrics & Gynecology

## 2018-07-06 NOTE — Patient Instructions (Signed)
1. Chronic LLQ pain Pelvic ultrasound findings reviewed with patient.  Reassured that no gynecologic pathology is present.  Given that there was no finding to explain her left lower abdominal pains, and abdominal exam to rule out any hernia was performed.  No midline ventral hernia, no evidence of hernia in the inguinal area or femoral area.  Although no hernia was felt, patient was tender at the left lower abdominal wall when lifting the head and chest.  Patient will pursue investigation with her family physician as needed, possible evaluation with a general surgeon.  Raynelle FanningJulie, good seeing you today!

## 2018-09-05 ENCOUNTER — Encounter: Payer: Self-pay | Admitting: Obstetrics & Gynecology

## 2018-09-05 ENCOUNTER — Ambulatory Visit (INDEPENDENT_AMBULATORY_CARE_PROVIDER_SITE_OTHER): Payer: 59 | Admitting: Obstetrics & Gynecology

## 2018-09-05 VITALS — BP 128/82 | Ht 62.5 in | Wt 295.0 lb

## 2018-09-05 DIAGNOSIS — Z6841 Body Mass Index (BMI) 40.0 and over, adult: Secondary | ICD-10-CM

## 2018-09-05 DIAGNOSIS — F329 Major depressive disorder, single episode, unspecified: Secondary | ICD-10-CM | POA: Diagnosis not present

## 2018-09-05 DIAGNOSIS — Z01419 Encounter for gynecological examination (general) (routine) without abnormal findings: Secondary | ICD-10-CM

## 2018-09-05 DIAGNOSIS — Z3046 Encounter for surveillance of implantable subdermal contraceptive: Secondary | ICD-10-CM | POA: Diagnosis not present

## 2018-09-05 DIAGNOSIS — F419 Anxiety disorder, unspecified: Secondary | ICD-10-CM | POA: Diagnosis not present

## 2018-09-05 MED ORDER — ESCITALOPRAM OXALATE 20 MG PO TABS: 20.0000 mg | ORAL_TABLET | Freq: Every day | ORAL | 5 refills | Status: DC

## 2018-09-05 MED ORDER — ALPRAZOLAM 0.25 MG PO TABS: 0.2500 mg | ORAL_TABLET | Freq: Two times a day (BID) | ORAL | 1 refills | Status: DC | PRN

## 2018-09-05 NOTE — Progress Notes (Signed)
Adrienne FarmerJulie E Perez-Flores 06/04/1981 [id number]   History:    37 y.o. 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  Depressive Sxs, but no suicidal ideations.  Well on Nexplanon with light spaced menstrual periods.  No change in left intermittent pelvic pain.  Pelvic ultrasound normal with a small follicular cyst on the left ovary in September 2019.  Breasts normal.  Body mass index 53.10.  Not physically active.  Suboptimal nutrition.   Past medical history,surgical history, family history and social history were all reviewed and documented in the EPIC chart.  Gynecologic History No LMP recorded. (Menstrual status: Irregular Periods). Contraception: Nexplanon Last Pap: 2016. Results were: normal Last mammogram: Never Bone Density: Never Colonoscopy: Never  Obstetric History OB History  Gravida Para Term Preterm AB Living  6 4 4  0 2 4  SAB TAB Ectopic Multiple Live Births      1 0 1    # Outcome Date GA Lbr Len/2nd Weight Sex Delivery Anes PTL Lv  6 Term 01/19/16 661w1d 09:26 / 00:11 6 lb 10.7 oz (3.025 kg) M Vag-Spont EPI  LIV  5 AB           4 Ectopic           3 Term           2 Term           1 Term             Obstetric Comments  1 stepson      ROS: A ROS was performed and pertinent positives and negatives are included in the history.  GENERAL: No fevers or chills. HEENT: No change in vision, no earache, sore throat or sinus congestion. NECK: No pain or stiffness. CARDIOVASCULAR: No chest pain or pressure. No palpitations. PULMONARY: No shortness of breath, cough or wheeze. GASTROINTESTINAL: No abdominal pain, nausea, vomiting or diarrhea, melena or bright red blood per rectum. GENITOURINARY: No urinary frequency, urgency, hesitancy or dysuria. MUSCULOSKELETAL: No joint or muscle pain, no back pain, no recent trauma. DERMATOLOGIC: No rash, no itching, no  lesions. ENDOCRINE: No polyuria, polydipsia, no heat or cold intolerance. No recent change in weight. HEMATOLOGICAL: No anemia or easy bruising or bleeding. NEUROLOGIC: No headache, seizures, numbness, tingling or weakness. PSYCHIATRIC: No depression, no loss of interest in normal activity or change in sleep pattern.     Exam:   BP 128/82   Ht 5' 2.5" (1.588 m)   Wt 295 lb (133.8 kg)   BMI 53.10 kg/m   Body mass index is 53.1 kg/m.  General appearance : Well developed well nourished female. No acute distress HEENT: Eyes: no retinal hemorrhage or exudates,  Neck supple, trachea midline, no carotid bruits, no thyroidmegaly Lungs: Clear to auscultation, no rhonchi or wheezes, or rib retractions  Heart: Regular rate and rhythm, no murmurs or gallops Breast:Examined in sitting and supine position were symmetrical in appearance, no palpable masses or tenderness,  no skin retraction, no nipple inversion, no nipple discharge, no skin discoloration, no axillary or supraclavicular lymphadenopathy Abdomen: no palpable masses or tenderness, no rebound or guarding Extremities: no edema or skin discoloration or tenderness  Pelvic: Vulva: Normal             Vagina: No gross lesions or discharge  Cervix: No gross lesions or discharge.  Pap reflex done.  Uterus  AV, normal size, shape and consistency, non-tender and mobile  Adnexa  Without masses or tenderness  Anus: Normal   Assessment/Plan:  37 y.o. female for annual exam   1. Encounter for routine gynecological examination with Papanicolaou smear of cervix Normal gynecologic exam.  Pelvic ultrasound normal in September 2019.  Pap reflex done today.  Breast exam normal.  F/U here for Fasting Health Labs.  2. Encounter for surveillance of implantable subdermal contraceptive Well on Nexplanon.  3. Anxiety and depression Recommend psychotherapy.  Was referred to a psychiatrist, needs to organize the appointment as soon as possible.  Decision to  start on Lexapro 20 mg daily in the meantime.  Usage, risks and benefits reviewed with patient. Also prescribed alprazolam 0.25 mg to take as needed twice a day.  Usage, risks and benefits reviewed.  Prescriptions sent to pharmacy.  Recommend increased physical activity to benefit from endorphins.  4. Class 3 severe obesity due to excess calories without serious comorbidity with body mass index (BMI) of 50.0 to 59.9 in adult [facility]) Body mass index of 53.10.  Recommend lower calorie/carb diet such as [employer] and aerobic physical activity 5 times a week with weightlifting every 2 days.  Other orders - escitalopram (LEXAPRO) 20 MG tablet; Take 1 tablet (20 mg total) by mouth daily. - ALPRAZolam (XANAX) 0.25 MG tablet; Take 1 tablet (0.25 mg total) by mouth 2 (two) times daily as needed for anxiety.  Counseling on above issues and coordination of care more than 50% for 15 minutes.  Genia Del MD, 10:51 AM 09/05/2018

## 2018-09-06 LAB — PAP IG W/ RFLX HPV ASCU

## 2018-09-10 ENCOUNTER — Encounter: Payer: Self-pay | Admitting: Obstetrics & Gynecology

## 2018-09-10 NOTE — Patient Instructions (Addendum)
1. Encounter for routine gynecological examination with Papanicolaou smear of cervix Normal gynecologic exam.  Pelvic ultrasound normal in September 2019.  Pap reflex done today.  Breast exam normal.  F/U here for Fasting Health Labs.  2. Encounter for surveillance of implantable subdermal contraceptive Well on Nexplanon.  3. Anxiety and depression Recommend psychotherapy.  Was referred to a psychiatrist, needs to organize the appointment as soon as possible.  Decision to start on Lexapro 20 mg daily in the meantime.  Usage, risks and benefits reviewed with patient. Also prescribed alprazolam 0.25 mg to take as needed twice a day.  Usage, risks and benefits reviewed.  Prescriptions sent to pharmacy.  Recommend increased physical activity to benefit from endorphins.  4. Class 3 severe obesity due to excess calories without serious comorbidity with body mass index (BMI) of 50.0 to 59.9 in adult [facility]) Body mass index of 53.10.  Recommend lower calorie/carb diet such as [employer] diet and aerobic physical activity 5 times a week with weightlifting every 2 days.  Other orders - escitalopram (LEXAPRO) 20 MG tablet; Take 1 tablet (20 mg total) by mouth daily. - ALPRAZolam (XANAX) 0.25 MG tablet; Take 1 tablet (0.25 mg total) by mouth 2 (two) times daily as needed for anxiety.  Raynelle FanningJulie, it was a pleasure seeing you today!  I will inform you of your results as soon as they are available.

## 2019-03-17 ENCOUNTER — Other Ambulatory Visit: Payer: Self-pay | Admitting: Obstetrics & Gynecology

## 2019-03-17 NOTE — Telephone Encounter (Signed)
At 09/05/18 visit you wrote"  "3. Anxiety and depression Recommend psychotherapy.  Was referred to a psychiatrist, needs to organize the appointment as soon as possible.  Decision to start on Lexapro 20 mg daily in the meantime.  Usage, risks and benefits reviewed with patient. Also prescribed alprazolam 0.25 mg to take as needed twice a day.  Usage, risks and benefits reviewed.  Prescriptions sent to pharmacy.  Recommend increased physical activity to benefit from endorphins."  You prescribed #30 w 5 refills and it has run out. Ce is not due until November. Ok to refill?

## 2019-03-29 ENCOUNTER — Other Ambulatory Visit: Payer: Self-pay | Admitting: Obstetrics & Gynecology

## 2019-05-28 ENCOUNTER — Other Ambulatory Visit: Payer: Self-pay | Admitting: Obstetrics & Gynecology

## 2019-05-29 NOTE — Telephone Encounter (Signed)
Per note on 09/05/2018 "Recommend psychotherapy.  Was referred to a psychiatrist, needs to organize the appointment as soon as possible.  Decision to start on Lexapro 20 mg daily in the meantime.  Usage, risks and benefits reviewed with patient. Also prescribed alprazolam 0.25 mg to take as needed twice a day.  Usage, risks and benefits reviewed.  Prescriptions sent to pharmacy. "  I asked patient if she scheduled with psychotherapy and she did not I gave her some names to call, however she does need a refill on Lexapro 20 mg tablet.

## 2019-09-15 ENCOUNTER — Encounter: Payer: 59 | Admitting: Obstetrics & Gynecology

## 2019-10-27 ENCOUNTER — Other Ambulatory Visit: Payer: Self-pay

## 2019-10-28 ENCOUNTER — Encounter: Payer: Self-pay | Admitting: Obstetrics & Gynecology

## 2019-10-28 ENCOUNTER — Ambulatory Visit (INDEPENDENT_AMBULATORY_CARE_PROVIDER_SITE_OTHER): Payer: No Typology Code available for payment source | Admitting: Obstetrics & Gynecology

## 2019-10-28 VITALS — BP 126/84 | Ht 62.5 in | Wt 307.0 lb

## 2019-10-28 DIAGNOSIS — F419 Anxiety disorder, unspecified: Secondary | ICD-10-CM

## 2019-10-28 DIAGNOSIS — Z01419 Encounter for gynecological examination (general) (routine) without abnormal findings: Secondary | ICD-10-CM

## 2019-10-28 DIAGNOSIS — Z8619 Personal history of other infectious and parasitic diseases: Secondary | ICD-10-CM | POA: Diagnosis not present

## 2019-10-28 DIAGNOSIS — Z3046 Encounter for surveillance of implantable subdermal contraceptive: Secondary | ICD-10-CM

## 2019-10-28 DIAGNOSIS — F329 Major depressive disorder, single episode, unspecified: Secondary | ICD-10-CM

## 2019-10-28 DIAGNOSIS — Z6841 Body Mass Index (BMI) 40.0 and over, adult: Secondary | ICD-10-CM

## 2019-10-28 MED ORDER — ESCITALOPRAM OXALATE 20 MG PO TABS: 20.0000 mg | ORAL_TABLET | Freq: Every day | ORAL | 12 refills | Status: DC

## 2019-10-28 MED ORDER — VALACYCLOVIR HCL 1 G PO TABS: ORAL_TABLET | ORAL | 2 refills | Status: AC

## 2019-10-28 NOTE — Progress Notes (Signed)
Adrienne Cline 1981-02-15 [id number]   History:    39 y.o. [id number] Married  RP:  Established patient presenting for annual gyn exam   HPI:  On Nexplanon x 03/2016, overdue for new Nexplanon.  No BTB.  No pelvic pain.  No pain with IC.  Pelvic ultrasound normal in 06/2018. Breasts normal.  Body mass index last year 53.10, now 55.26.  Not physically active.  Suboptimal nutrition.    Past medical history,surgical history, family history and social history were all reviewed and documented in the EPIC chart.  Gynecologic History Patient's last menstrual period was 10/21/2019.  Obstetric History OB History  Gravida Para Term Preterm AB Living  6 4 4  0 2 4  SAB TAB Ectopic Multiple Live Births      1 0 1    # Outcome Date GA Lbr Len/2nd Weight Sex Delivery Anes PTL Lv  6 Term 01/19/16 [redacted]w[redacted]d 09:26 / 00:11 6 lb 10.7 oz (3.025 kg) M Vag-Spont EPI  LIV  5 AB           4 Ectopic           3 Term           2 Term           1 Term             Obstetric Comments  1 stepson      ROS: A ROS was performed and pertinent positives and negatives are included in the history.  GENERAL: No fevers or chills. HEENT: No change in vision, no earache, sore throat or sinus congestion. NECK: No pain or stiffness. CARDIOVASCULAR: No chest pain or pressure. No palpitations. PULMONARY: No shortness of breath, cough or wheeze. GASTROINTESTINAL: No abdominal pain, nausea, vomiting or diarrhea, melena or bright red blood per rectum. GENITOURINARY: No urinary frequency, urgency, hesitancy or dysuria. MUSCULOSKELETAL: No joint or muscle pain, no back pain, no recent trauma. DERMATOLOGIC: No rash, no itching, no lesions. ENDOCRINE: No polyuria, polydipsia, no heat or cold intolerance. No recent change in weight. HEMATOLOGICAL: No anemia or easy bruising or bleeding. NEUROLOGIC: No headache, seizures, numbness, tingling or weakness. PSYCHIATRIC: No depression, no loss of interest in normal activity or change in  sleep pattern.     Exam:   BP 126/84   Ht 5' 2.5" (1.588 m)   Wt (!) 307 lb (139.3 kg)   LMP 10/21/2019 Comment: nexplanon- 03/2016  BMI 55.26 kg/m   Body mass index is 55.26 kg/m.  General appearance : Well developed well nourished female. No acute distress HEENT: Eyes: no retinal hemorrhage or exudates,  Neck supple, trachea midline, no carotid bruits, no thyroidmegaly Lungs: Clear to auscultation, no rhonchi or wheezes, or rib retractions  Heart: Regular rate and rhythm, no murmurs or gallops Breast:Examined in sitting and supine position were symmetrical in appearance, no palpable masses or tenderness,  no skin retraction, no nipple inversion, no nipple discharge, no skin discoloration, no axillary or supraclavicular lymphadenopathy Abdomen: no palpable masses or tenderness, no rebound or guarding Extremities: no edema or skin discoloration or tenderness  Pelvic: Vulva: Normal             Vagina: No gross lesions or discharge  Cervix: No gross lesions or discharge.  Pap reflex done.  Uterus  AV, normal size, shape and consistency, non-tender and mobile  Adnexa  Without masses or tenderness  Anus: Normal   Assessment/Plan:  39 y.o. female for annual exam   1.  Encounter for routine gynecological examination with Papanicolaou smear of cervix Normal gynecologic exam.  Pap reflex done today.  Breast exam normal.  Health labs with family physician.  2. Encounter for surveillance of implantable subdermal contraceptive Overdue for New Nexplanon.  Strict condom use until f/u to remove Nexplanon and insert a new Nexplanon.   Will do a UPT prior to insertion of the New Nexplanon.  3. H/O herpes genitalis Valacyclovir treatment for recurrences represcribed.  4. Anxiety and depression Anxiety and depression stable on Lexapro 20 mg daily.  Prescription sent to pharmacy.  5. Class 3 severe obesity due to excess calories without serious comorbidity with body mass index (BMI) of 50.0  to 59.9 in adult [facility]) Worsening severe obesity.  Recommend a low calorie/carb diet such as [employer].  May benefit from intermittent fasting.  Aerobic physical activities 5 times a week and light weightlifting every 2 days recommended.  Other orders - escitalopram (LEXAPRO) 20 MG tablet; Take 1 tablet (20 mg total) by mouth daily. - valACYclovir (VALTREX) 1000 MG tablet; TAKE 1 TABLET BY MOUTH DAILY FOR 5 DAYS THEN DAILY AS NEEDED.  Genia Del MD, 12:34 PM 10/28/2019

## 2019-10-29 LAB — PAP IG W/ RFLX HPV ASCU

## 2019-10-31 ENCOUNTER — Encounter: Payer: Self-pay | Admitting: Obstetrics & Gynecology

## 2019-10-31 NOTE — Patient Instructions (Signed)
1. Encounter for routine gynecological examination with Papanicolaou smear of cervix Normal gynecologic exam.  Pap reflex done today.  Breast exam normal.  Health labs with family physician.  2. Encounter for surveillance of implantable subdermal contraceptive Overdue for New Nexplanon.  Strict condom use until f/u to remove Nexplanon and insert a new Nexplanon.   Will do a UPT prior to insertion of the New Nexplanon.  3. H/O herpes genitalis Valacyclovir treatment for recurrences represcribed.  4. Anxiety and depression Anxiety and depression stable on Lexapro 20 mg daily.  Prescription sent to pharmacy.  5. Class 3 severe obesity due to excess calories without serious comorbidity with body mass index (BMI) of 50.0 to 59.9 in adult [facility]) Worsening severe obesity.  Recommend a low calorie/carb diet such as [employer].  May benefit from intermittent fasting.  Aerobic physical activities 5 times a week and light weightlifting every 2 days recommended.  Other orders - escitalopram (LEXAPRO) 20 MG tablet; Take 1 tablet (20 mg total) by mouth daily. - valACYclovir (VALTREX) 1000 MG tablet; TAKE 1 TABLET BY MOUTH DAILY FOR 5 DAYS THEN DAILY AS NEEDED.  Adrienne Cline, it was a pleasure seeing you today!  I will inform you of your results as soon as they are available.

## 2019-11-13 ENCOUNTER — Other Ambulatory Visit: Payer: Self-pay

## 2019-11-14 ENCOUNTER — Encounter: Payer: Self-pay | Admitting: Obstetrics & Gynecology

## 2019-11-14 ENCOUNTER — Ambulatory Visit (INDEPENDENT_AMBULATORY_CARE_PROVIDER_SITE_OTHER): Payer: No Typology Code available for payment source | Admitting: Obstetrics & Gynecology

## 2019-11-14 VITALS — BP 128/80

## 2019-11-14 DIAGNOSIS — Z3046 Encounter for surveillance of implantable subdermal contraceptive: Secondary | ICD-10-CM

## 2019-11-14 NOTE — Progress Notes (Signed)
Adrienne Cline Feb 14, 1981 [id number]        39 y.o.  [id number] Married  RP: Nexplanon removal and insertion of a new Nexplanon  HPI: Well on Nexplanon x 03/2016.  No BTB.  No pelvic pain.   OB History  Gravida Para Term Preterm AB Living  6 4 4  0 2 4  SAB TAB Ectopic Multiple Live Births      1 0 1    # Outcome Date GA Lbr Len/2nd Weight Sex Delivery Anes PTL Lv  6 Term 01/19/16 [redacted]w[redacted]d 09:26 / 00:11 6 lb 10.7 oz (3.025 kg) M Vag-Spont EPI  LIV  5 AB           4 Ectopic           3 Term           2 Term           1 Term             Obstetric Comments  1 stepson     Past medical history,surgical history, problem list, medications, allergies, family history and social history were all reviewed and documented in the EPIC chart.   Directed ROS with pertinent positives and negatives documented in the history of present illness/assessment and plan.  Exam:  Vitals:   11/14/19 1416  BP: 128/80   General appearance:  Normal                                                             Nexplanon procedure note (removal and insertion)  The patient presented to the office today requesting for removal of her Nexplanon that was placed in the year 6/2017on her Left arm.   On examination the nexplanon implant was palpated and the distal end  (end  closest to the elbow) was marked. The area was sterilized with Betadine solution. 1% lidocaine was used for local anesthesia and approximately 1 cc  was injected into the site that was marked where the incision was to be made, under the implant in an effort to keep it  close to the skin surface and all the way where the new Nexplanon will be inserted.Slight pressure pushing downward was made at the proximal end  of the implant in an effort to stabilize it. A bulge appeared indicating the distal end of the implant. A small transverse incision of 2 mm was made at that location. By gently pushing the implant toward the incision, the tip became  visible. Grasping the implant with a curved forcep facilitated in gently removing the implant. Full confirmation of the entire implant which is 4 cm long was inspected and was intact and was shown to the patient and discarded.    The preloaded disposable Nexplanon was removed from its sterile casing.  The applicator was held above the needle at the textured surface area. The transparent protector was removed. The small incision already made for removal was entered with the tip of the needle angled at 30. The Nexplanon applicator was lowered to a horizontal position. While lifting the skin with the tip of the needle the needle was then slid to its full length. The applicator was kept in this position with the needle inserted to its full length. The purple slider was unlocked by  pushing it slightly downward. The slider was fully moved back until it stopped. This allowed the implant to be in the final subdermal position and the needle to be locked inside the body of the applicator. The applicator was then removed. 3 Steri-Strips were applied over the incision, a band-aid and a bandage was placed which the patient is to remove tomorrow. No complications, the patient tolerated procedure well and was released home with instructions.   Assessment/Plan:  39 y.o. [id number]   1. Encounter for removal and reinsertion of Nexplanon Well on Nexplanon since June 2017, overdue for replacement.  No contraindication to continue with Nexplanon contraception.  Easy removal of Nexplanon without complication, well-tolerated.  Easy insertion of new Nexplanon at the same site.  Well-tolerated and no complication.  Postprocedure precautions reviewed.  Genia Del MD, 2:29 PM 11/14/2019

## 2019-11-14 NOTE — Patient Instructions (Signed)
1. Encounter for removal and reinsertion of Nexplanon Well on Nexplanon since June 2017, overdue for replacement.  No contraindication to continue with Nexplanon contraception.  Easy removal of Nexplanon without complication, well-tolerated.  Easy insertion of new Nexplanon at the same site.  Well-tolerated and no complication.  Postprocedure precautions reviewed.  Adrienne Cline, it was a pleasure seeing you today!

## 2019-12-03 ENCOUNTER — Encounter: Payer: Self-pay | Admitting: Gynecology

## 2020-01-09 ENCOUNTER — Telehealth: Payer: Self-pay | Admitting: *Deleted

## 2020-01-09 MED ORDER — FLUCONAZOLE 150 MG PO TABS: 150.0000 mg | ORAL_TABLET | Freq: Every day | ORAL | 1 refills | Status: DC

## 2020-01-09 MED ORDER — TINIDAZOLE 500 MG PO TABS: ORAL_TABLET | ORAL | 1 refills | Status: DC

## 2020-01-09 NOTE — Telephone Encounter (Signed)
Patient called c/o vaginal itching/ irritation and yellow discharge, no odor noted. Reports history of BV. Please advise

## 2020-01-09 NOTE — Telephone Encounter (Signed)
Patient informed. 

## 2020-01-09 NOTE — Telephone Encounter (Signed)
Please send both Tinidazole 2 tab PO BID x 2 days and Fluconazole 150 mg 1 tab PO daily x 3.  Refill of both x 1.  After that, will need a Wet prep if recurs.

## 2020-02-14 HISTORY — PX: HAND SURGERY: SHX662

## 2020-11-08 ENCOUNTER — Other Ambulatory Visit: Payer: Self-pay | Admitting: Obstetrics & Gynecology

## 2020-11-11 ENCOUNTER — Other Ambulatory Visit: Payer: Self-pay | Admitting: *Deleted

## 2020-11-11 ENCOUNTER — Telehealth: Payer: Self-pay

## 2020-11-11 MED ORDER — ESCITALOPRAM OXALATE 20 MG PO TABS: 20.0000 mg | ORAL_TABLET | Freq: Every day | ORAL | 1 refills | Status: DC

## 2020-11-11 NOTE — Telephone Encounter (Signed)
Needs Lexapro sent to different pharmacy.

## 2020-11-11 NOTE — Telephone Encounter (Signed)
Patient schedule annual exam on 3/2/2, needs refill on Lexapro 20 mg tablet.

## 2020-12-15 ENCOUNTER — Ambulatory Visit: Payer: No Typology Code available for payment source | Admitting: Obstetrics & Gynecology

## 2021-01-18 ENCOUNTER — Telehealth: Payer: Self-pay | Admitting: *Deleted

## 2021-01-18 NOTE — Telephone Encounter (Signed)
-----   Message from Lillia Carmel, [place] sent at 01/18/2021 12:51 PM EDT ----- Patient needs a rx on Lexapro. AEX is for (02/17/21).

## 2021-01-18 NOTE — Telephone Encounter (Signed)
Dr.Lavoie see the below note, okay to refill?

## 2021-01-19 ENCOUNTER — Ambulatory Visit: Payer: No Typology Code available for payment source | Admitting: Nurse Practitioner

## 2021-01-19 ENCOUNTER — Encounter: Payer: Self-pay | Admitting: Nurse Practitioner

## 2021-01-19 ENCOUNTER — Other Ambulatory Visit: Payer: Self-pay

## 2021-01-19 ENCOUNTER — Telehealth: Payer: Self-pay | Admitting: *Deleted

## 2021-01-19 VITALS — BP 130/82

## 2021-01-19 DIAGNOSIS — F419 Anxiety disorder, unspecified: Secondary | ICD-10-CM

## 2021-01-19 DIAGNOSIS — N6322 Unspecified lump in the left breast, upper inner quadrant: Secondary | ICD-10-CM

## 2021-01-19 MED ORDER — ESCITALOPRAM OXALATE 20 MG PO TABS: 20.0000 mg | ORAL_TABLET | Freq: Every day | ORAL | 1 refills | Status: DC

## 2021-01-19 NOTE — Telephone Encounter (Signed)
-----   Message from [name], NP sent at 01/19/2021 10:34 AM EDT ----- Please send referral for left breast ultrasound for lump @ 11 o'clock. Thank you.

## 2021-01-19 NOTE — Telephone Encounter (Signed)
Patient saw Elmarie Shiley and she approved Rx until annual exam on 02/17/21

## 2021-01-19 NOTE — Progress Notes (Signed)
   Acute Office Visit  Subjective:    Patient ID: Adrienne Cline, female    DOB: 1981-07-17, 40 y.o.   MRN: [id number]   HPI 41 y.o. presents today for left breast lump. Lump is tender to touch without redness, swelling or nipple discharge. No family history of breast cancer. She is also requesting a refill on Lexapro. She has a follow up next month with Dr. Seymour Bars regarding management for anxiety.    Review of Systems  Constitutional: Negative.   Left breast: positive for lump, negative for redness, swelling, or nipple discharge     Objective:    Physical Exam Constitutional:      Appearance: Normal appearance.  Chest:  Breasts:     Right: Normal.     Left: Mass and tenderness present. No swelling, inverted nipple, nipple discharge, skin change, axillary adenopathy or supraclavicular adenopathy.     Lymphadenopathy:     Upper Body:     Left upper body: No supraclavicular or axillary adenopathy.     BP 130/82 (Cuff Size: Large)   LMP  (LMP Unknown)  Wt Readings from Last 3 Encounters:  10/28/19 (!) 307 lb (139.3 kg)  09/05/18 295 lb (133.8 kg)  05/24/18 294 lb (133.4 kg)        Assessment & Plan:   Problem List Items Addressed This Visit   None   Visit Diagnoses    Breast lump on left side at 11 o'clock position    -  Primary   Anxiety       Relevant Medications   escitalopram (LEXAPRO) 20 MG tablet     Plan: We will send referral for left breast ultrasound for further evaluation. Refill for Lexapro provided to get her to her follow up appointment next month with Dr. Seymour Bars. She is agreeable to plan.     Adrienne Mackie DNP, 10:32 AM 01/19/2021

## 2021-01-19 NOTE — Telephone Encounter (Signed)
Patient scheduled on 02/25/21 @ 1:00pm at the breast center of [place]. Patient aware she can call daily/weekly to check for any cancellations.

## 2021-02-17 ENCOUNTER — Ambulatory Visit (INDEPENDENT_AMBULATORY_CARE_PROVIDER_SITE_OTHER): Payer: No Typology Code available for payment source | Admitting: Obstetrics & Gynecology

## 2021-02-17 ENCOUNTER — Encounter: Payer: Self-pay | Admitting: Obstetrics & Gynecology

## 2021-02-17 ENCOUNTER — Other Ambulatory Visit: Payer: Self-pay

## 2021-02-17 VITALS — BP 126/76 | Ht 62.5 in | Wt 300.0 lb

## 2021-02-17 DIAGNOSIS — Z3046 Encounter for surveillance of implantable subdermal contraceptive: Secondary | ICD-10-CM

## 2021-02-17 DIAGNOSIS — B372 Candidiasis of skin and nail: Secondary | ICD-10-CM | POA: Diagnosis not present

## 2021-02-17 DIAGNOSIS — Z6841 Body Mass Index (BMI) 40.0 and over, adult: Secondary | ICD-10-CM

## 2021-02-17 DIAGNOSIS — Z01419 Encounter for gynecological examination (general) (routine) without abnormal findings: Secondary | ICD-10-CM | POA: Diagnosis not present

## 2021-02-17 DIAGNOSIS — N6322 Unspecified lump in the left breast, upper inner quadrant: Secondary | ICD-10-CM

## 2021-02-17 MED ORDER — CLOBETASOL PROPIONATE 0.05 % EX CREA: 1.0000 "application " | TOPICAL_CREAM | Freq: Every day | CUTANEOUS | 1 refills | Status: AC

## 2021-02-17 MED ORDER — NYSTATIN 100000 UNIT/GM EX POWD: 1.0000 "application " | Freq: Two times a day (BID) | CUTANEOUS | 4 refills | Status: DC

## 2021-02-17 NOTE — Progress Notes (Signed)
Adrienne Cline [birth year] [id number]   History:    40 y.o. [id number] Married  [id number] presenting for annual gyn exam   HPI: On new Nexplanon x 10/2019.No BTB.  No pelvic pain.  No pain with IC. Pap Neg 10/2019.  Breasts Rt normal, Lt with small lump for which a Dx mammo/US is scheduled. Body mass index 54. Not physically active. Suboptimal nutrition.    Past medical history,surgical history, family history and social history were all reviewed and documented in the EPIC chart.  Gynecologic History No LMP recorded (lmp unknown). Patient has had an implant.  Obstetric History OB History  Gravida Para Term Preterm AB Living  _0 0 2 4  SAB IAB Ectopic Multiple Live Births      1 0 1    # Outcome Date GA Lbr Len/2nd Weight Sex Delivery Anes PTL Lv  6 Term 01/19/16 52w1d09:26 / 00:11 6 lb 10.7 oz (3.025 kg) M Vag-Spont EPI  LIV  5 AB           4 Ectopic           3 Term           2 Term           1 Term             Obstetric Comments  1 stepson      ROS: A ROS was performed and pertinent positives and negatives are included in the history.  GENERAL: No fevers or chills. HEENT: No change in vision, no earache, sore throat or sinus congestion. NECK: No pain or stiffness. CARDIOVASCULAR: No chest pain or pressure. No palpitations. PULMONARY: No shortness of breath, cough or wheeze. GASTROINTESTINAL: No abdominal pain, nausea, vomiting or diarrhea, melena or bright red blood per rectum. GENITOURINARY: No urinary frequency, urgency, hesitancy or dysuria. MUSCULOSKELETAL: No joint or muscle pain, no back pain, no recent trauma. DERMATOLOGIC: No rash, no itching, no lesions. ENDOCRINE: No polyuria, polydipsia, no heat or cold intolerance. No recent change in weight. HEMATOLOGICAL: No anemia or easy bruising or bleeding. NEUROLOGIC: No headache, seizures, numbness, tingling or weakness. PSYCHIATRIC: No depression, no loss of interest in normal activity or  change in sleep pattern.     Exam:   BP 126/76   Ht 5' 2.5" (1.588 m)   Wt 300 lb (136.1 kg)   LMP  (LMP Unknown)   BMI 54.00 kg/m   Body mass index is 54 kg/m.  General appearance : Well developed well nourished female. No acute distress HEENT: Eyes: no retinal hemorrhage or exudates,  Neck supple, trachea midline, no carotid bruits, no thyroidmegaly Lungs: Clear to auscultation, no rhonchi or wheezes, or rib retractions  Heart: Regular rate and rhythm, no murmurs or gallops Breast:Examined in sitting and supine position were symmetrical in appearance.  Rt breast:  no palpable masses or tenderness,  no skin retraction, no nipple inversion, no nipple discharge, no skin discoloration, no axillary or supraclavicular lymphadenopathy.  Lt breast with a 1 cm round nodule superficially at 11 O'Clock, no skin retraction, no nipple inversion, no nipple discharge, no skin discoloration, no axillary or supraclavicular lymphadenopathy.   Abdomen: no palpable masses or tenderness, no rebound or guarding Extremities: no edema or skin discoloration or tenderness  Pelvic: Vulva: Normal             Vagina: No gross lesions or discharge  Cervix: No gross lesions or discharge  Uterus  AV, normal size,  shape and consistency, non-tender and mobile  Adnexa  Without masses or tenderness  Anus: Normal   Assessment/Plan:  40 y.o. female for annual exam   1. Well female exam with routine gynecological exam Normal gynecologic exam.  Pap test - January 2021, no indication to repeat this year.  Breast exam with a small left breast nodule, probably benign, for which a left diagnostic mammogram and ultrasound are scheduled.  Recommend health labs.  If not done elsewhere, will do here. - CBC; Future - Comp Met (CMET); Future - TSH; Future - Lipid Profile; Future - Vitamin D 1,25 dihydroxy; Future  2. Encounter for surveillance of implantable subdermal contraceptive Well on Nexplanon x 10/2019.  No CI to  continue.  3. Breast lump on left side at 11 o'clock position Left Dx mammo/US scheduled.  4. Yeast infection of the skin Nystatin powder prescribed.  Usage reviewed.  Clobetasol cream as needed.  5. Class 3 severe obesity due to excess calories without serious comorbidity with body mass index (BMI) of 50.0 to 59.9 in adult [facility]) Recommend a lower calorie/carb diet.  Aerobic activities 5 times a week and light weightlifting every 2 days.  Other orders - nystatin (NYSTATIN) powder; Apply 1 application topically 2 (two) times daily. - clobetasol cream (TEMOVATE) 0.05 %; Apply 1 application topically at bedtime for 14 days.  Princess Bruins MD, 10:41 AM 02/17/2021

## 2021-02-20 ENCOUNTER — Encounter: Payer: Self-pay | Admitting: Obstetrics & Gynecology

## 2021-02-21 ENCOUNTER — Telehealth: Payer: Self-pay | Admitting: *Deleted

## 2021-02-21 NOTE — Telephone Encounter (Signed)
-----   Message from Genia Del, MD sent at 02/20/2021 11:33 AM EDT ----- Regarding: Fasting Health Labs Please let patient know that I recommend fasting health labs.  If not done elsewhere, bring back here, labs ordered.

## 2021-02-21 NOTE — Telephone Encounter (Signed)
Patient scheduled on 02/22/21 @ 10:30am aware to be fasting.

## 2021-02-22 ENCOUNTER — Other Ambulatory Visit: Payer: No Typology Code available for payment source

## 2021-02-22 ENCOUNTER — Other Ambulatory Visit: Payer: Self-pay

## 2021-02-22 DIAGNOSIS — Z01419 Encounter for gynecological examination (general) (routine) without abnormal findings: Secondary | ICD-10-CM

## 2021-02-25 ENCOUNTER — Other Ambulatory Visit: Payer: Managed Care, Other (non HMO)

## 2021-02-26 LAB — CBC
HCT: 41.1 % (ref 35.0–45.0)
Hemoglobin: 13.3 g/dL (ref 11.7–15.5)
MCH: 28.1 pg (ref 27.0–33.0)
MCHC: 32.4 g/dL (ref 32.0–36.0)
MCV: 86.7 fL (ref 80.0–100.0)
MPV: 11 fL (ref 7.5–12.5)
Platelets: 300 10*3/uL (ref 140–400)
RBC: 4.74 10*6/uL (ref 3.80–5.10)
RDW: 13.1 % (ref 11.0–15.0)
WBC: 7.4 10*3/uL (ref 3.8–10.8)

## 2021-02-26 LAB — COMPREHENSIVE METABOLIC PANEL
AG Ratio: 1.3 (calc) (ref 1.0–2.5)
ALT: 15 U/L (ref 6–29)
AST: 17 U/L (ref 10–30)
Albumin: 3.9 g/dL (ref 3.6–5.1)
Alkaline phosphatase (APISO): 97 U/L (ref 31–125)
BUN: 10 mg/dL (ref 7–25)
CO2: 26 mmol/L (ref 20–32)
Calcium: 9.1 mg/dL (ref 8.6–10.2)
Chloride: 103 mmol/L (ref 98–110)
Creat: 0.85 mg/dL (ref 0.50–1.10)
Globulin: 3.1 g/dL (calc) (ref 1.9–3.7)
Glucose, Bld: 86 mg/dL (ref 65–99)
Potassium: 4.1 mmol/L (ref 3.5–5.3)
Sodium: 138 mmol/L (ref 135–146)
Total Bilirubin: 0.3 mg/dL (ref 0.2–1.2)
Total Protein: 7 g/dL (ref 6.1–8.1)

## 2021-02-26 LAB — TSH: TSH: 3.67 mIU/L

## 2021-02-26 LAB — LIPID PANEL
Cholesterol: 182 mg/dL (ref <200)
HDL: 42 mg/dL — ABNORMAL LOW (ref >=50)
LDL Cholesterol (Calc): 115 mg/dL (calc) — ABNORMAL HIGH
Non-HDL Cholesterol (Calc): 140 mg/dL (calc) — ABNORMAL HIGH (ref <130)
Total CHOL/HDL Ratio: 4.3 (calc) (ref <5.0)
Triglycerides: 140 mg/dL (ref <150)

## 2021-02-26 LAB — VITAMIN D 1,25 DIHYDROXY
Vitamin D 1, 25 (OH)2 Total: 39 pg/mL (ref 18–72)
Vitamin D2 1, 25 (OH)2: <8 pg/mL
Vitamin D3 1, 25 (OH)2: 39 pg/mL

## 2021-03-25 ENCOUNTER — Other Ambulatory Visit: Payer: Self-pay

## 2021-03-25 ENCOUNTER — Ambulatory Visit
Admission: RE | Admit: 2021-03-25 | Discharge: 2021-03-25 | Disposition: A | Discharge status: Home / Self Care | Payer: No Typology Code available for payment source | Source: Ambulatory Visit | Attending: Nurse Practitioner | Admitting: Nurse Practitioner

## 2021-03-25 ENCOUNTER — Ambulatory Visit: Payer: Self-pay

## 2021-03-25 DIAGNOSIS — N6322 Unspecified lump in the left breast, upper inner quadrant: Secondary | ICD-10-CM

## 2021-04-28 ENCOUNTER — Other Ambulatory Visit: Payer: Self-pay | Admitting: Obstetrics & Gynecology

## 2021-04-28 DIAGNOSIS — F419 Anxiety disorder, unspecified: Secondary | ICD-10-CM

## 2021-05-19 ENCOUNTER — Other Ambulatory Visit: Payer: Self-pay

## 2021-05-19 ENCOUNTER — Ambulatory Visit (HOSPITAL_COMMUNITY)
Admission: EM | Admit: 2021-05-19 | Discharge: 2021-05-19 | Disposition: A | Discharge status: Home / Self Care | Payer: No Typology Code available for payment source | Attending: Student | Admitting: Student

## 2021-05-19 DIAGNOSIS — F410 Panic disorder [episodic paroxysmal anxiety] without agoraphobia: Secondary | ICD-10-CM | POA: Diagnosis not present

## 2021-05-19 MED ORDER — HYDROXYZINE PAMOATE 25 MG PO CAPS: 25.0000 mg | ORAL_CAPSULE | Freq: Three times a day (TID) | ORAL | 0 refills | Status: DC | PRN

## 2021-05-19 NOTE — ED Notes (Signed)
Discharge instructions provided and Pt stated understanding. Personal belongings returned from the black locker. Escorted Pt to front lobby. Pt alert, orient and ambulatory. Safety maintained.

## 2021-05-19 NOTE — Discharge Instructions (Addendum)
Please take hydroxyzine 25 mg up to 3 times a day as needed.  Also, please follow-up with outpatient psychiatry and counseling.

## 2021-05-19 NOTE — Progress Notes (Addendum)
Patient presents to the [facility] as a walk-in seeking help for hewr anxiety.  Patient states that she has been having panic attacks over the past week.  She states that she has also been experiencing headaches, nausea and vomitting.  She states that she has been having several panic attacks daily. Patient states that she hasalso been experiencingchest pain.  Patient states that she takes Lexapro for her anxiety, but states that the medication is no longer working. Patient denies SI/HI/Psychosis.  Patient states that she is under a lot of stress with four children, working  and trying to keep her house clean for her husband who is OCD and not really understanding and supportive of her. Patient is routine

## 2021-05-19 NOTE — ED Provider Notes (Signed)
[facility] Medical Screening Exam  Patient Name: Adrienne Cline MRN: [id number] Date of Evaluation: 05/19/21 Chief Complaint:  Panic Attack Diagnosis:  Final diagnoses:  Panic attack    History of Present illness: Adrienne Cline is a 40 y.o. female w/ hx of anxiety and depression presents to [facility] due to overwhelming anxiety and panic attack. Patient was tearful and sweating when speaking with this Clinical research associate. Pt states she has been having progressive anxiety for past week ever since she missed her dose of lexapro last Thursday. Pt refilled it day after but also missed a dose Saturday due to severe vomiting and diarrhea that pt stated was likely due to "E. Coli". Pt stated she stopped having vomiting after Saturday but has had diarrhea for 3 days since then. Pt has noticed progressive anxiety, specifically regarding finances and "dying". Pt perseverates on these thoughts and is unable to stop herself from thinking about her family dying or their financial situation. Pt has also had more frequent panic attacks. Pt states that her panic attacks are not well controlled and consist of shortness of breath, tachycardia, and overwhelming stress. Pt denies present SI/HI/AVH.  Pt has problems with anxiety and depression ever since she was put on Nexplanon. Pt states her emotion regulation has been very poor since then. Pt currently on lexapro for depression and anxiety. Pt states that medication normally helps but it does not help with panic attacks. Pt stated attempting to try Benadryl to see if it would aid with panic attack but it did not. Pt also tried a shot of alcohol which did help at that time. Pt denies regular use of alcohol, tobacco, or illicit substances.  Psychiatric Specialty Exam  Presentation  General Appearance:Appropriate for Environment Eye Contact:Minimal Speech:Clear and Coherent Speech Volume:Normal Handedness:No data recorded  Mood and Affect   Mood: Depressed; Hopeless Affect: Depressed; Labile  Thought Process  Thought Processes: Goal Directed; Linear; Coherent Descriptions of Associations:Intact Orientation:Full (Time, Place and Person) Thought Content:Perseveration   Hallucinations:None Ideas of Reference:None Suicidal Thoughts:No Homicidal Thoughts:No  Sensorium  Memory: Immediate Good; Recent Good; Remote Good Judgment: Good Insight: Good  Executive Functions  Concentration: Good Attention Span: Good Recall: Good Fund of Knowledge: Good Language: Good  Psychomotor Activity  Psychomotor Activity: Increased  Assets  Assets: Communication Skills; Desire for Improvement; Housing  Sleep  Sleep: Good Number of hours:  7  No data recorded  Physical Exam: Physical Exam Vitals and nursing note reviewed.  Constitutional:      General: She is not in acute distress.    Appearance: She is well-developed.  HENT:     Head: Normocephalic and atraumatic.  Eyes:     Conjunctiva/sclera: Conjunctivae normal.  Cardiovascular:     Rate and Rhythm: Normal rate and regular rhythm.     Heart sounds: No murmur heard. Pulmonary:     Effort: Pulmonary effort is normal. No respiratory distress.     Breath sounds: Normal breath sounds.  Abdominal:     Palpations: Abdomen is soft.     Tenderness: There is no abdominal tenderness.  Musculoskeletal:     Cervical back: Neck supple.  Skin:    General: Skin is warm and dry.  Neurological:     Mental Status: She is alert.   ROS Blood pressure 115/64, pulse (!) 110, temperature 98.4 F (36.9 C), temperature source Oral, resp. rate 16, SpO2 97 %. There is no height or weight on file to calculate BMI.  Musculoskeletal: Strength & Muscle  Tone: within normal limits Gait & Station: normal Patient leans: Front   [facility] MSE Discharge Disposition for Follow up and Recommendations: Based on my evaluation the patient does not appear to have an emergency medical  condition and can be discharged with resources and follow up care in outpatient services for Medication Management and Individual Therapy  Provided pt w/ information regarding nearby outpatient psychiatry and counseling services.  Park Pope, MD 05/19/2021, 5:22 PM

## 2021-06-15 ENCOUNTER — Other Ambulatory Visit: Payer: Self-pay | Admitting: Nurse Practitioner

## 2021-06-15 ENCOUNTER — Telehealth: Payer: Self-pay | Admitting: *Deleted

## 2021-06-15 DIAGNOSIS — F410 Panic disorder [episodic paroxysmal anxiety] without agoraphobia: Secondary | ICD-10-CM

## 2021-06-15 DIAGNOSIS — F419 Anxiety disorder, unspecified: Secondary | ICD-10-CM

## 2021-06-15 MED ORDER — ESCITALOPRAM OXALATE 20 MG PO TABS: 20.0000 mg | ORAL_TABLET | Freq: Every day | ORAL | 0 refills | Status: DC

## 2021-06-15 MED ORDER — ESCITALOPRAM OXALATE 10 MG PO TABS: 10.0000 mg | ORAL_TABLET | Freq: Every day | ORAL | 0 refills | Status: DC

## 2021-06-15 NOTE — Telephone Encounter (Signed)
20 mg is typically the max dose recommended but in some cases a higher dose is needed to be effective, so I will provide this temporarily since it has worked for her and she is finding psychiatist. I provided her with Lexapro 10 mg tablets #30, so she will take a 20 mg and a 10 mg tablet each day for total of 30 mg.

## 2021-06-15 NOTE — Telephone Encounter (Signed)
Patient informed. 

## 2021-06-15 NOTE — Telephone Encounter (Signed)
Dr.Lavoie patient) called today asking for some help with her Lexapro 20 mg tablet, patient said she is having a really had time with anxiety, was seen on 05/19/21 in ER for panic attack, reports they did not help her. She is in the process of getting care with psychiatrist, she does not feel the need to harm herself. She said the Lexapro 20 mg is not longer working for her so she took a 30 mg dose and it did help relieve some of her symptoms and keep her calm. She asked if you would be willing to give her a 1 month supply of the 30 mg dose of Lexapro? She was very tearful on the phone and has much anxiety. Please advise

## 2021-06-15 NOTE — Telephone Encounter (Signed)
Patient informed with the below note, she has only 2 pills of 20 mg tablet. She asked if you can send in 30 tablets of the 20 mg dose a well? Rx should go to [facility] on [address]. I will send the Lexpro 10 mg tablet to this pharmacy and cancel the one at [facility]. I did call the pharmacy and confirm no refill for 20 mg tablet.

## 2021-06-15 NOTE — Telephone Encounter (Signed)
Lexapro 20 mg sent to [facility]. Thank you.

## 2021-10-06 ENCOUNTER — Other Ambulatory Visit: Payer: Self-pay

## 2021-10-06 ENCOUNTER — Ambulatory Visit (INDEPENDENT_AMBULATORY_CARE_PROVIDER_SITE_OTHER): Payer: No Typology Code available for payment source | Admitting: Obstetrics & Gynecology

## 2021-10-06 ENCOUNTER — Encounter: Payer: Self-pay | Admitting: Obstetrics & Gynecology

## 2021-10-06 VITALS — BP 108/72 | HR 85

## 2021-10-06 DIAGNOSIS — Z113 Encounter for screening for infections with a predominantly sexual mode of transmission: Secondary | ICD-10-CM | POA: Diagnosis not present

## 2021-10-06 DIAGNOSIS — Z975 Presence of (intrauterine) contraceptive device: Secondary | ICD-10-CM

## 2021-10-06 DIAGNOSIS — N921 Excessive and frequent menstruation with irregular cycle: Secondary | ICD-10-CM | POA: Diagnosis not present

## 2021-10-06 DIAGNOSIS — N926 Irregular menstruation, unspecified: Secondary | ICD-10-CM

## 2021-10-06 LAB — PREGNANCY, URINE: Preg Test, Ur: NEGATIVE

## 2021-10-06 NOTE — Progress Notes (Signed)
error 

## 2021-10-06 NOTE — Progress Notes (Signed)
° ° °  Adrienne Cline [birth year] [id number]        40 y.o.  [id number]   RP: BTB on Nexplanon  HPI: Nexplanon x 10/2019.  Was well on it until 1 month ago when started having frequent BTB with cramping.  No abnormal vaginal discharge.  No fever.   OB History  Gravida Para Term Preterm AB Living  6 4 4  0 2 4  SAB IAB Ectopic Multiple Live Births      1 0 1    # Outcome Date GA Lbr Len/2nd Weight Sex Delivery Anes PTL Lv  6 Term 01/19/16 [redacted]w[redacted]d 09:26 / 00:11 6 lb 10.7 oz (3.025 kg) M Vag-Spont EPI  LIV  5 AB           4 Ectopic           3 Term           2 Term           1 Term             Obstetric Comments  1 stepson     Past medical history,surgical history, problem list, medications, allergies, family history and social history were all reviewed and documented in the EPIC chart.   Directed ROS with pertinent positives and negatives documented in the history of present illness/assessment and plan.  Exam:  Vitals:   10/06/21 1153  BP: 108/72  Pulse: 85  SpO2: 98%   General appearance:  Normal  Abdomen: Normal  Gynecologic exam: Vulva normal.  Speculum:  Cervix/Vagina normal.  No blood.  No abnormal d/c.  Gono-Chlam done on cervix.  Bimanual exam:  Uterus AV, normal volume, NT, mobile.  No adnexal mass, NT.  UPT Neg   Assessment/Plan:  40 y.o. 41   1. Breakthrough bleeding on Nexplanon  Nexplanon x 10/2019.  Was well on it until 1 month ago when started having frequent BTB with cramping.  No abnormal vaginal discharge.  No fever.  Normal Gyn exam today, no bleeding.  Gono-Chlam done.  Will complete the investigation with a Pelvic 11/2019 at f/u.  Will decide on contraception per Pelvic US results.  Husband planning a Vasectomy. - US Transvaginal Non-OB; Future  2. Irregular bleeding As above.  UPT Neg. - Pregnancy, urine - US Transvaginal Non-OB; Future - C. trachomatis/N. gonorrhoeae RNA  3. Screen for STD (sexually transmitted disease) - C. trachomatis/N.  gonorrhoeae RNA  Other orders - FLUoxetine (PROZAC) 20 MG capsule; Take 20 mg by mouth daily. - albuterol (VENTOLIN HFA) 108 (90 Base) MCG/ACT inhaler; SMARTSIG:2 Puff(s) By Mouth Every 4 Hours PRN   [place] MD, 12:25 PM 10/06/2021

## 2021-10-07 LAB — C. TRACHOMATIS/N. GONORRHOEAE RNA
C. trachomatis RNA, TMA: NOT DETECTED
N. gonorrhoeae RNA, TMA: NOT DETECTED

## 2021-11-03 ENCOUNTER — Ambulatory Visit: Payer: No Typology Code available for payment source | Admitting: Obstetrics & Gynecology

## 2021-11-03 ENCOUNTER — Other Ambulatory Visit: Payer: Self-pay

## 2021-11-03 ENCOUNTER — Encounter: Payer: Self-pay | Admitting: Obstetrics & Gynecology

## 2021-11-03 ENCOUNTER — Ambulatory Visit (INDEPENDENT_AMBULATORY_CARE_PROVIDER_SITE_OTHER): Payer: No Typology Code available for payment source

## 2021-11-03 VITALS — BP 112/74

## 2021-11-03 DIAGNOSIS — Z975 Presence of (intrauterine) contraceptive device: Secondary | ICD-10-CM

## 2021-11-03 DIAGNOSIS — N926 Irregular menstruation, unspecified: Secondary | ICD-10-CM

## 2021-11-03 DIAGNOSIS — N921 Excessive and frequent menstruation with irregular cycle: Secondary | ICD-10-CM

## 2021-11-03 DIAGNOSIS — Z978 Presence of other specified devices: Secondary | ICD-10-CM

## 2021-11-03 DIAGNOSIS — R309 Painful micturition, unspecified: Secondary | ICD-10-CM | POA: Diagnosis not present

## 2021-11-03 LAB — URINALYSIS, COMPLETE W/RFL CULTURE
Bacteria, UA: NONE SEEN /HPF
Bilirubin Urine: NEGATIVE
Glucose, UA: NEGATIVE
Hgb urine dipstick: NEGATIVE
Hyaline Cast: NONE SEEN /LPF
Ketones, ur: NEGATIVE
Leukocyte Esterase: NEGATIVE
Nitrites, Initial: NEGATIVE
Protein, ur: NEGATIVE
RBC / HPF: NONE SEEN /HPF (ref 0–2)
Specific Gravity, Urine: 1.02 (ref 1.001–1.035)
WBC, UA: NONE SEEN /HPF (ref 0–5)
pH: 6.5 (ref 5.0–8.0)

## 2021-11-03 LAB — NO CULTURE INDICATED

## 2021-11-03 NOTE — Progress Notes (Signed)
ab 

## 2021-11-03 NOTE — Progress Notes (Signed)
° ° °  Adrienne Cline 1981-03-07 [id number]        41 y.o.  [id number]   RP: BTB on Nexplanon for Pelvic US  HPI: BTB on Nexplanon, improved x last visit 10/06/2021.  Nexplanon x 10/2019. No abnormal vaginal discharge.  No fever.    OB History  Gravida Para Term Preterm AB Living  5 4 4  0 1 4  SAB IAB Ectopic Multiple Live Births      1 0 4    # Outcome Date GA Lbr Len/2nd Weight Sex Delivery Anes PTL Lv  5 Term 01/19/16 [redacted]w[redacted]d 09:26 / 00:11 6 lb 10.7 oz (3.025 kg) M Vag-Spont EPI  LIV  4 Ectopic           3 Term           2 Term           1 Term             Obstetric Comments  1 stepson     Past medical history,surgical history, problem list, medications, allergies, family history and social history were all reviewed and documented in the EPIC chart.   Directed ROS with pertinent positives and negatives documented in the history of present illness/assessment and plan.  Exam:  Vitals:   11/03/21 1105  BP: 112/74   General appearance:  Normal  Pelvic 07-28-2005 today: T/V images.  Anteverted uterus normal in size and shape with no myometrial mass.  The uterus is measured at 9.21 x 5.46 x 4.04.  The endometrial lining is thin and symmetrical measured at 2.04 mm.  No mass or thickening seen.  Both ovaries are mobile and normal in size with normal follicular pattern and normal perfusion.  No adnexal mass seen.  No free fluid in the pelvis.  U/A: Completely negative   Assessment/Plan:  41 y.o. 41   1. Breakthrough bleeding on Nexplanon Improved BTB x last visit 10/06/2021.  Nexplanon x 10/2019. No abnormal vaginal discharge.  No fever.  Pelvic 11/2019 normal with thin endometrial line.  Reassured.  Will continue with Nexplanon.   2. Pain with urination U/A Negative. - Urinalysis,Complete w/RFL Culture  Other orders - REFLEXIVE URINE CULTURE   [place] MD, 11:38 AM 11/03/2021

## 2021-11-11 ENCOUNTER — Encounter: Payer: Self-pay | Admitting: Obstetrics & Gynecology

## 2022-10-22 IMAGING — MG DIGITAL DIAGNOSTIC BILAT W/ TOMO W/ CAD
6 of 10 series · 6 of 30 positions shown · non-contrast
Comparison: None.

ACR Breast Density Category a: The breast tissue is almost entirely
fatty.

CLINICAL DATA: 40-year-old with a palpable lump in the upper inner
subareolar LEFT breast. This is the patient's initial baseline
mammogram.

She states that she had a serious dog bite to the upper inner LEFT
breast approximately 1 year ago. No family history of breast cancer.
EXAM:
DIGITAL DIAGNOSTIC BILATERAL MAMMOGRAM WITH TOMOSYNTHESIS AND CAD
TECHNIQUE: Bilateral digital diagnostic mammography and breast tomosynthesis
was performed. The images were evaluated with computer-aided
detection.

[L MLO synth-2D]
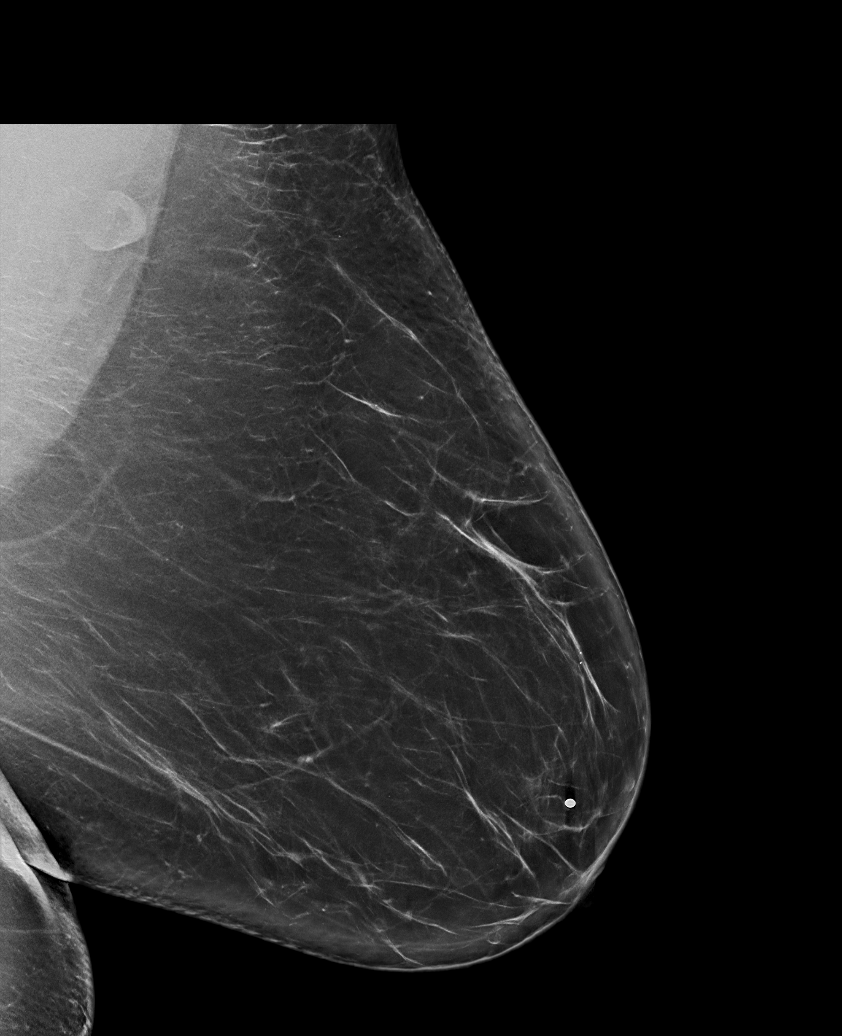

[L CC synth-2D (1 of 2)]
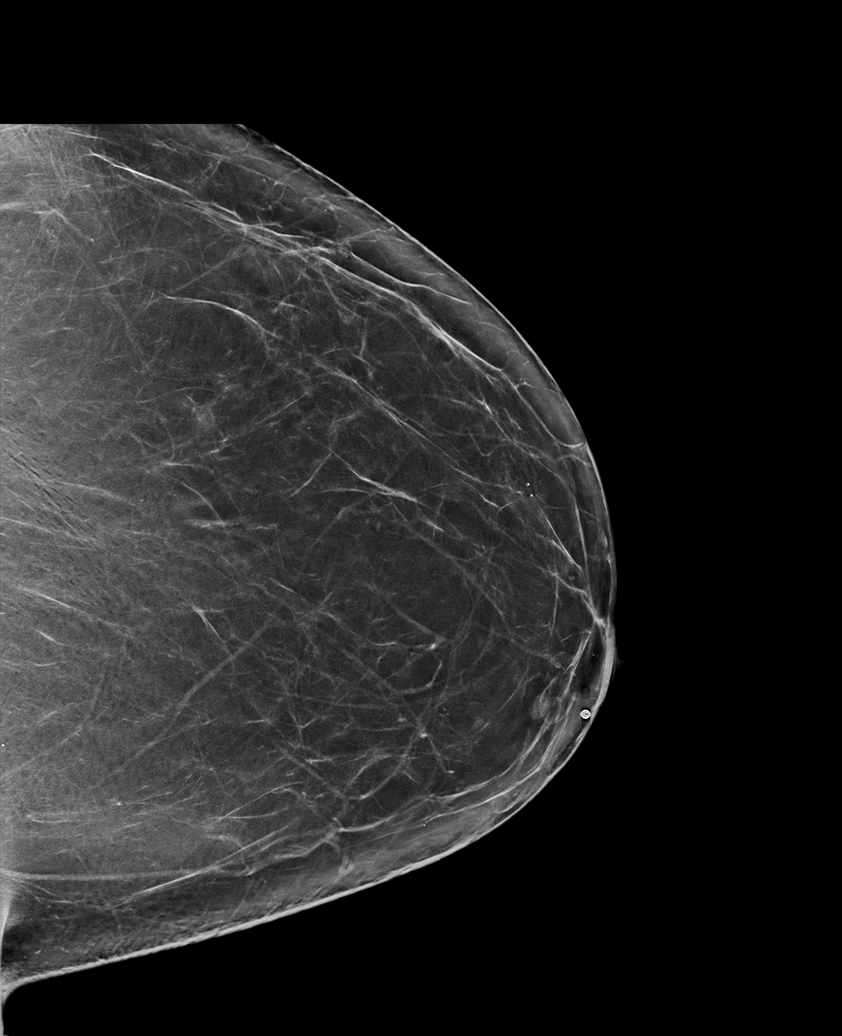

[L CC synth-2D (2 of 2)]
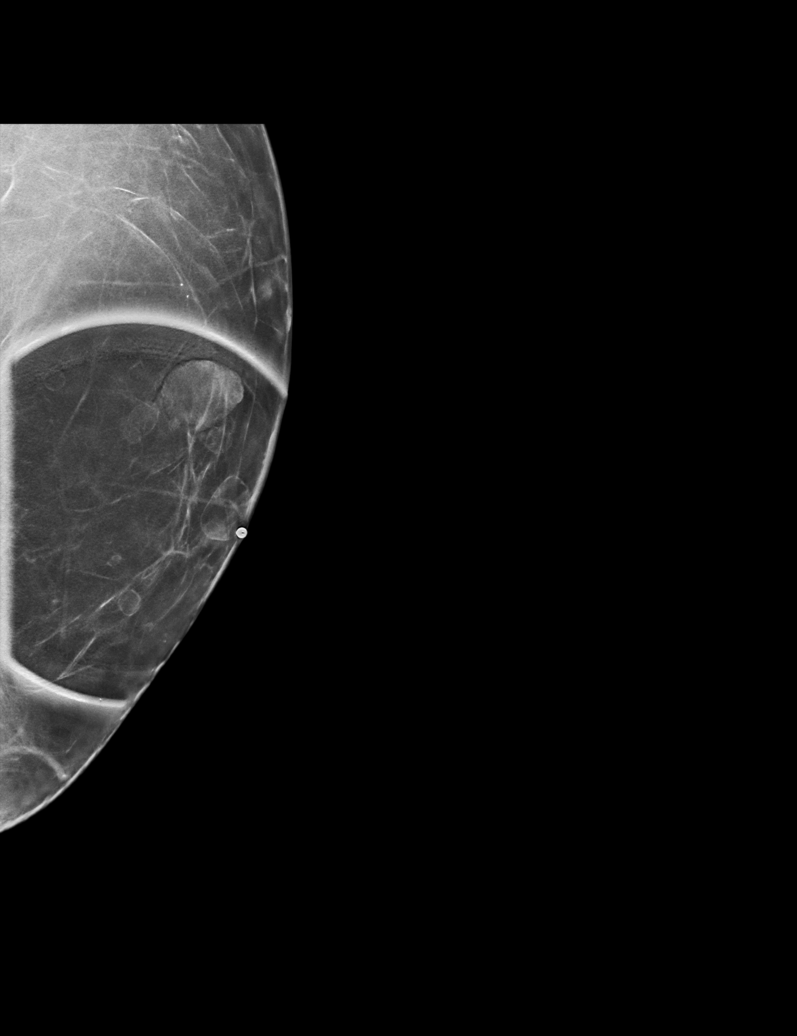

[R CC synth-2D]
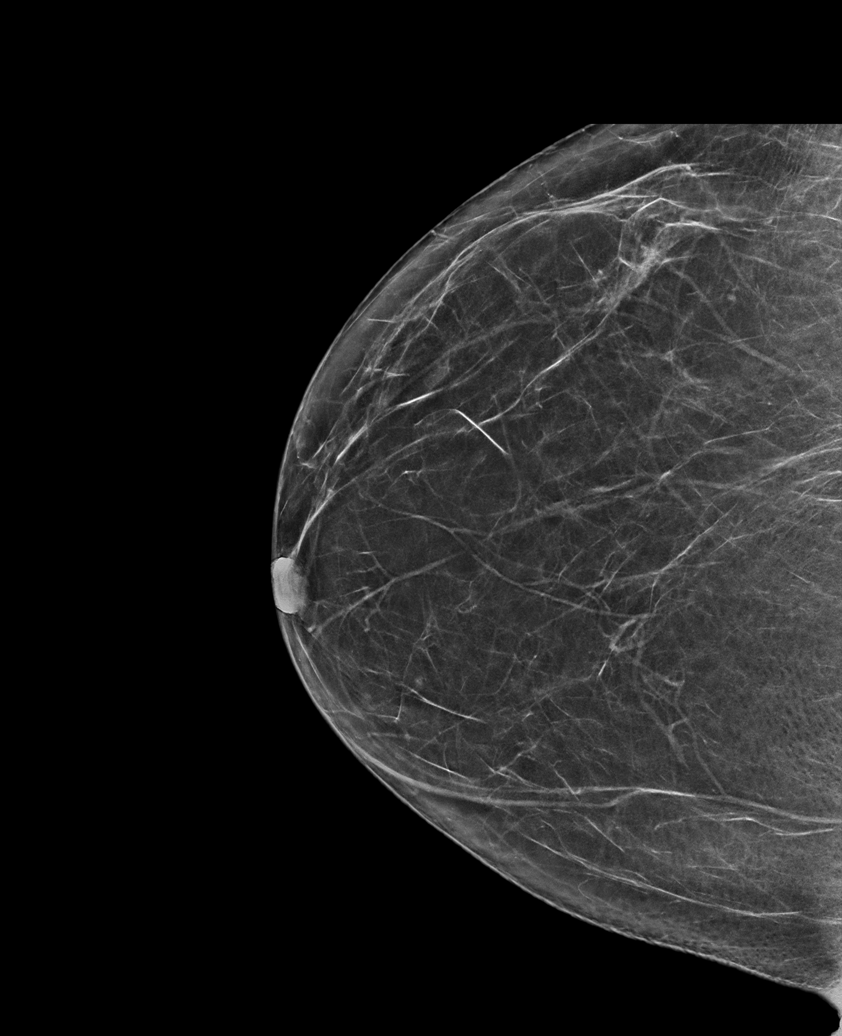

[R MLO synth-2D]
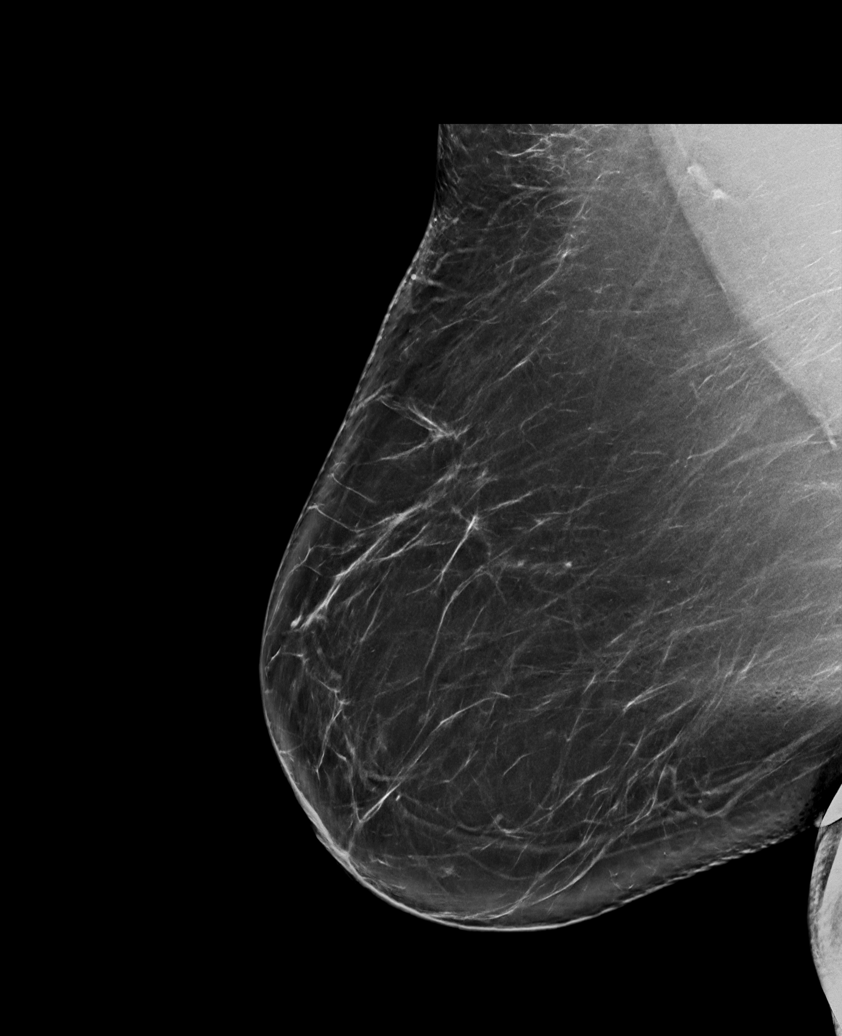

[R MLO tomo · tomo slice 47/94.0]
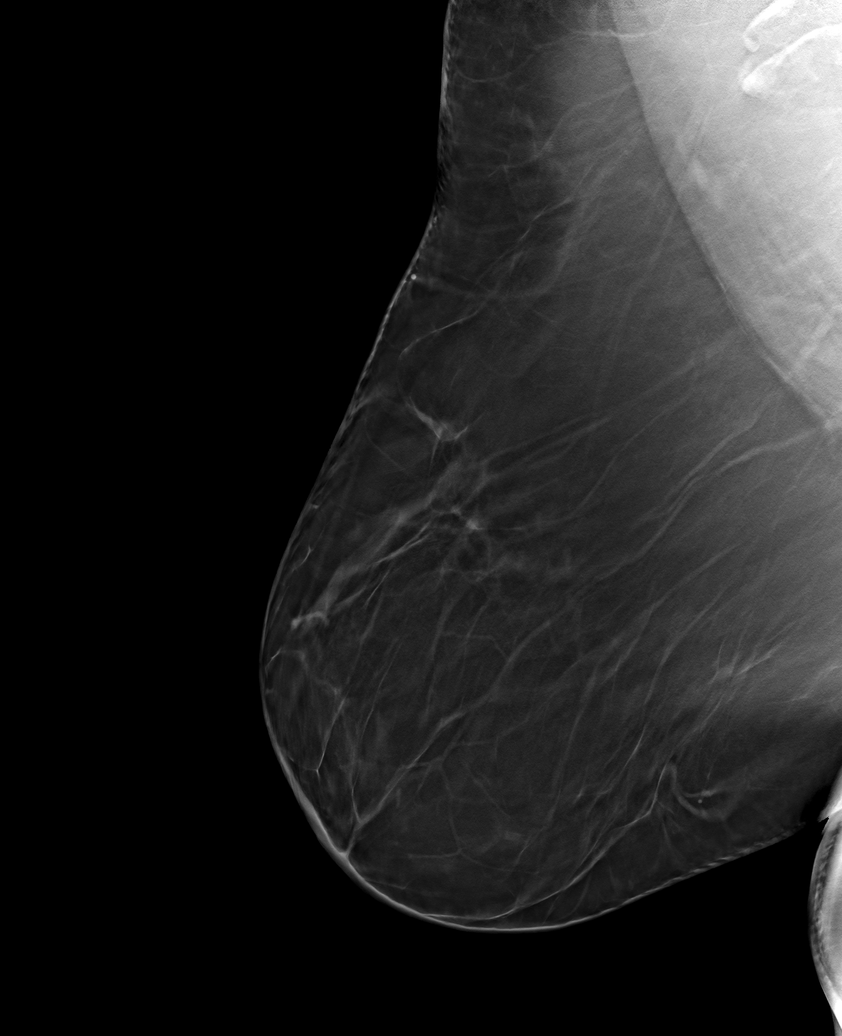

[6 of 30 positions shown; findings below may reference images not displayed]

FINDINGS: Full field CC and MLO views of both breasts and a spot compression
tangential view of the palpable concern in the LEFT breast were
obtained.

RIGHT: No findings suspicious for malignancy.

LEFT: No findings suspicious for malignancy. Multiple benign oil
cysts are present in the upper inner subareolar and periareolar
location, the largest of which measures approximately 1.4 cm and
corresponds to the palpable concern.
IMPRESSION: 1. No mammographic evidence of malignancy involving either breast.
2. Benign oil cyst in the upper inner subareolar LEFT breast which
accounts for the palpable concern.

RECOMMENDATION:
Screening mammogram in one year.(Code:K4-4-KGT)

I have discussed the findings and recommendations with the patient.
If applicable, a reminder letter will be sent to the patient
regarding the next appointment.

BI-RADS CATEGORY  2: Benign.
